# Patient Record
Sex: Male | Born: 1985 | Race: Black or African American | Hispanic: No | Marital: Married | Smoking: Never smoker
Health system: Southern US, Community
[De-identification: ages and names within clinical notes are randomized; demographics above are authoritative.]

## PROBLEM LIST (undated history)

## (undated) DIAGNOSIS — I10 Essential (primary) hypertension: Secondary | ICD-10-CM

## (undated) DIAGNOSIS — E119 Type 2 diabetes mellitus without complications: Secondary | ICD-10-CM

## (undated) DIAGNOSIS — K219 Gastro-esophageal reflux disease without esophagitis: Secondary | ICD-10-CM

## (undated) DIAGNOSIS — F419 Anxiety disorder, unspecified: Secondary | ICD-10-CM

## (undated) HISTORY — PX: ARTHROSCOPIC REPAIR ACL: SUR80

## (undated) HISTORY — PX: TONSILLECTOMY: SUR1361

---

## 2011-07-21 DIAGNOSIS — I10 Essential (primary) hypertension: Secondary | ICD-10-CM | POA: Insufficient documentation

## 2011-07-21 DIAGNOSIS — E1169 Type 2 diabetes mellitus with other specified complication: Secondary | ICD-10-CM | POA: Insufficient documentation

## 2011-07-21 DIAGNOSIS — E669 Obesity, unspecified: Secondary | ICD-10-CM | POA: Insufficient documentation

## 2011-11-14 HISTORY — PX: ARTHROSCOPIC REPAIR ACL: SUR80

## 2012-04-17 DIAGNOSIS — E119 Type 2 diabetes mellitus without complications: Secondary | ICD-10-CM | POA: Insufficient documentation

## 2013-03-19 DIAGNOSIS — M222X9 Patellofemoral disorders, unspecified knee: Secondary | ICD-10-CM | POA: Insufficient documentation

## 2014-10-21 ENCOUNTER — Ambulatory Visit: Payer: Self-pay | Admitting: Family Medicine

## 2014-10-21 LAB — CBC WITH DIFFERENTIAL/PLATELET
Basophil #: 0.1 10*3/uL (ref 0.0–0.1)
Basophil %: 0.7 %
Eosinophil #: 0.1 10*3/uL (ref 0.0–0.7)
Eosinophil %: 1.7 %
HCT: 43.4 % (ref 40.0–52.0)
HGB: 14 g/dL (ref 13.0–18.0)
Lymphocyte #: 2.8 10*3/uL (ref 1.0–3.6)
Lymphocyte %: 32.7 %
MCH: 25.7 pg — ABNORMAL LOW (ref 26.0–34.0)
MCHC: 32.2 g/dL (ref 32.0–36.0)
MCV: 80 fL (ref 80–100)
Monocyte #: 0.7 x10 3/mm (ref 0.2–1.0)
Monocyte %: 8.6 %
Neutrophil #: 4.8 10*3/uL (ref 1.4–6.5)
Neutrophil %: 56.3 %
Platelet: 226 10*3/uL (ref 150–440)
RBC: 5.43 10*6/uL (ref 4.40–5.90)
RDW: 14 % (ref 11.5–14.5)
WBC: 8.5 10*3/uL (ref 3.8–10.6)

## 2014-10-21 LAB — BASIC METABOLIC PANEL
Anion Gap: 11 (ref 7–16)
BUN: 12 mg/dL (ref 7–18)
Calcium, Total: 9 mg/dL (ref 8.5–10.1)
Chloride: 104 mmol/L (ref 98–107)
Co2: 25 mmol/L (ref 21–32)
Creatinine: 1.09 mg/dL (ref 0.60–1.30)
EGFR (African American): >60
EGFR (Non-African Amer.): >60
Glucose: 140 mg/dL — ABNORMAL HIGH (ref 65–99)
Osmolality: 281 (ref 275–301)
Potassium: 3.8 mmol/L (ref 3.5–5.1)
Sodium: 140 mmol/L (ref 136–145)

## 2014-10-21 LAB — RAPID INFLUENZA A&B ANTIGENS

## 2015-01-04 ENCOUNTER — Emergency Department: Payer: Self-pay | Admitting: Emergency Medicine

## 2015-05-16 ENCOUNTER — Encounter: Payer: Self-pay | Admitting: Emergency Medicine

## 2015-05-16 ENCOUNTER — Ambulatory Visit
Admission: EM | Admit: 2015-05-16 | Discharge: 2015-05-16 | Disposition: A | Discharge status: Home / Self Care | Payer: Managed Care, Other (non HMO) | Attending: Family Medicine | Admitting: Family Medicine

## 2015-05-16 ENCOUNTER — Ambulatory Visit: Payer: Managed Care, Other (non HMO)

## 2015-05-16 DIAGNOSIS — M25571 Pain in right ankle and joints of right foot: Secondary | ICD-10-CM | POA: Diagnosis present

## 2015-05-16 DIAGNOSIS — E119 Type 2 diabetes mellitus without complications: Secondary | ICD-10-CM | POA: Insufficient documentation

## 2015-05-16 DIAGNOSIS — Z794 Long term (current) use of insulin: Secondary | ICD-10-CM | POA: Diagnosis not present

## 2015-05-16 DIAGNOSIS — Y9301 Activity, walking, marching and hiking: Secondary | ICD-10-CM | POA: Diagnosis not present

## 2015-05-16 DIAGNOSIS — S93601A Unspecified sprain of right foot, initial encounter: Secondary | ICD-10-CM

## 2015-05-16 DIAGNOSIS — I10 Essential (primary) hypertension: Secondary | ICD-10-CM | POA: Diagnosis not present

## 2015-05-16 HISTORY — DX: Essential (primary) hypertension: I10

## 2015-05-16 HISTORY — DX: Type 2 diabetes mellitus without complications: E11.9

## 2015-05-16 MED ORDER — MELOXICAM 7.5 MG PO TABS: 7.5000 mg | ORAL_TABLET | Freq: Two times a day (BID) | ORAL | Status: DC

## 2015-05-16 MED ORDER — KETOROLAC TROMETHAMINE 60 MG/2ML IM SOLN
60.0000 mg | Freq: Once | INTRAMUSCULAR | Status: AC
  Administered 2015-05-16: 120 mg via INTRAMUSCULAR

## 2015-05-16 NOTE — ED Provider Notes (Signed)
CSN: [id number]     Arrival date & time 05/16/15  0804 History   First MD Initiated Contact with Patient 05/16/15 0845     Chief Complaint  Patient presents with  . 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 Tried Motrin 600 mg with improvement, but pain continues. Tried to work last night and pain was increased. Wears heavy work boots and they support but have no arches. Flat feet. Has inserts prescribed by [facility] but does not wear them because they are uncomfortable. Reports last A1C at 7.1  Past Medical History  Diagnosis Date  . Diabetes mellitus without complication   . Hypertension    Past Surgical History  Procedure Laterality Date  . Arthroscopic repair acl Right    History reviewed. No pertinent family history. History  Substance Use Topics  . Smoking status: Never Smoker   . Smokeless tobacco: Never Used  . Alcohol Use: No    Review of Systems Review of 10 systems negative for acute change except as referenced in HPI  Allergies  Sulfur  Home Medications   Prior to Admission medications   Medication Sig Start Date End Date Taking? Authorizing Provider  amLODipine (NORVASC) 5 MG tablet Take 5 mg by mouth daily.   Yes Historical Provider, MD  insulin aspart protamine- aspart (NOVOLOG MIX 70/30) (70-30) 100 UNIT/ML injection Inject 130 Units into the skin.   Yes Historical Provider, MD  lisinopril (PRINIVIL,ZESTRIL) 40 MG tablet Take 40 mg by mouth daily.   Yes Historical Provider, MD  meloxicam (MOBIC) 7.5 MG tablet Take 1 tablet (7.5 mg total) by mouth 2 (two) times daily. 05/16/15   Rae Halsted, PA-C   BP 126/57 mmHg  Pulse 99  Temp(Src) 97.9 F (36.6 C) (Tympanic)  Resp 18  Ht  (1.905 m)  Wt 394  lb (178.717 kg)  BMI 49.25 kg/m2  SpO2 97% Physical Exam Constitutional -alert and oriented,well appearing and in mild distress foot pain; grossly obese 394 pound  And 6'3"  .  Wearing ace wrap from office nurse Head-atraumatic Eyes- conjunctiva normal, EOMI ,conjugate gaze Nose- no congestion or rhinorrhea Mouth/throat- mucous membranes moist , Neck- supple  CV- regular rate,  Resp-no distress, normal respiratory effort,clear to auscultation bilaterally GI- no distention GU-  not examined MSK- Right mid-foot is swollen and erythematous, pain with compression and with weight bearing, he is flat footed,  ankle is normal, no swelling or pain; toes with good cap fill , normal sensation. Left leg and foot no tenderness nor edema,  Antalgic gait Neuro- normal speech and language,  Skin-warm,dry ,intact; no rash noted Psych-mood and affect grossly normal;   ED Course  Procedures (including critical care time) Labs Review Labs Reviewed - No data to display  Imaging Review Dg Foot Complete Right  05/16/2015   CLINICAL DATA:  RIGHT midfoot pain. Twisted foot. Difficulty bearing weight.  EXAM: RIGHT FOOT COMPLETE - 3+ VIEW  COMPARISON:  None.  FINDINGS: Anatomic alignment of bones of the RIGHT foot. 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 Attached metatarsal alignment appears normal. Fifth metatarsal base is normal. No fracture. Diffuse midfoot soft tissue swelling.  IMPRESSION: No acute osseous abnormality.   Electronically Signed   By: Andreas Newport M.D.   On:  05/16/2015 09:31   .this MDM   1. Foot sprain, right, initial encounter    .Plan: 1. Test/x-ray results and diagnosis reviewed with patient-given disc 2. rx as per orders; risks, benefits, potential side effects reviewed with patient 3. Recommend supportive treatment with Meloxicam; taught ace wrap support; encourage F/U re-evaluation with [facility] for insert revision 4. Encouraged to get back on DM  diet and improve HgbA1C, weight- for foot benefit 4. F/u prn if symptoms worsen or don't improve    Rae HalstedLaurie W Teshaun Olarte, PA-C 05/16/15 1041

## 2015-05-16 NOTE — ED Notes (Signed)
Right ankle pain for 3 days. Was mowing yard and stepped in hole and rolled his ankle. Painful, swelling

## 2015-05-16 NOTE — Discharge Instructions (Signed)
Foot Sprain The muscles and cord like structures which attach muscle to bone (tendons) that surround the feet are made up of units. A foot sprain can occur at the weakest spot in any of these units. This condition is most often caused by injury to or overuse of the foot, as from playing contact sports, or aggravating a previous injury, or from poor conditioning, or obesity. SYMPTOMS  Pain with movement of the foot.  Tenderness and swelling at the injury site.  Loss of strength is present in moderate or severe sprains. THE THREE GRADES OR SEVERITY OF FOOT SPRAIN ARE:  Mild (Grade I): Slightly pulled muscle without tearing of muscle or tendon fibers or loss of strength.  Moderate (Grade II): Tearing of fibers in a muscle, tendon, or at the attachment to bone, with small decrease in strength.  Severe (Grade III): Rupture of the muscle-tendon-bone attachment, with separation of fibers. Severe sprain requires surgical repair. Often repeating (chronic) sprains are caused by overuse. Sudden (acute) sprains are caused by direct injury or over-use. DIAGNOSIS  Diagnosis of this condition is usually by your own observation. If problems continue, a caregiver may be required for further evaluation and treatment. X-rays may be required to make sure there are not breaks in the bones (fractures) present. Continued problems may require physical therapy for treatment. PREVENTION  Use strength and conditioning exercises appropriate for your sport.  Warm up properly prior to working out.  Use athletic shoes that are made for the sport you are participating in.  Allow adequate time for healing. Early return to activities makes repeat injury more likely, and can lead to an unstable arthritic foot that can result in prolonged disability. Mild sprains generally heal in 3 to 10 days, with moderate and severe sprains taking 2 to 10 weeks. Your caregiver can help you determine the proper time required for  healing. HOME CARE INSTRUCTIONS   Apply ice to the injury for 15-20 minutes, 03-04 times per day. Put the ice in a plastic bag and place a towel between the bag of ice and your skin.  An elastic wrap (like an Ace bandage) may be used to keep swelling down.  Keep foot above the level of the heart, or at least raised on a footstool, when swelling and pain are present.  Try to avoid use other than gentle range of motion while the foot is painful. Do not resume use until instructed by your caregiver. Then begin use gradually, not increasing use to the point of pain. If pain does develop, decrease use and continue the above measures, gradually increasing activities that do not cause discomfort, until you gradually achieve normal use.  Use crutches if and as instructed, and for the length of time instructed.  Keep injured foot and ankle wrapped between treatments.  Massage foot and ankle for comfort and to keep swelling down. Massage from the toes up towards the knee.  Only take over-the-counter or prescription medicines for pain, discomfort, or fever as directed by your caregiver. SEEK IMMEDIATE MEDICAL CARE IF:   Your pain and swelling increase, or pain is not controlled with medications.  You have loss of feeling in your foot or your foot turns cold or blue.  You develop new, unexplained symptoms, or an increase of the symptoms that brought you to your caregiver. MAKE SURE YOU:   Understand these instructions.  Will watch your condition.  Will get help right away if you are not doing well or get worse. Document Released:   04/21/2002 Document Revised: 01/22/2012 Document Reviewed: 06/18/2008 ExitCare Patient Information 2015 [employer]. This information is not intended to replace advice given to you by your health care provider. Make sure you discuss any questions you have with your health care provider.  

## 2015-08-27 ENCOUNTER — Ambulatory Visit
Admission: EM | Admit: 2015-08-27 | Discharge: 2015-08-27 | Disposition: A | Discharge status: Home / Self Care | Payer: Managed Care, Other (non HMO) | Attending: Internal Medicine | Admitting: Internal Medicine

## 2015-08-27 ENCOUNTER — Encounter: Payer: Self-pay | Admitting: Emergency Medicine

## 2015-08-27 DIAGNOSIS — L03032 Cellulitis of left toe: Secondary | ICD-10-CM

## 2015-08-27 MED ORDER — CEPHALEXIN 500 MG PO CAPS: 500.0000 mg | ORAL_CAPSULE | Freq: Three times a day (TID) | ORAL | Status: AC

## 2015-08-27 MED ORDER — ACETAMINOPHEN 500 MG PO TABS: 1000.0000 mg | ORAL_TABLET | Freq: Four times a day (QID) | ORAL | Status: DC | PRN

## 2015-08-27 MED ORDER — MUPIROCIN 2 % EX OINT: 1.0000 "application " | TOPICAL_OINTMENT | Freq: Two times a day (BID) | CUTANEOUS | Status: DC

## 2015-08-27 NOTE — Discharge Instructions (Signed)
Paronychia Paronychia is an infection of the skin that surrounds a nail. It usually affects the skin around a fingernail, but it may also occur near a toenail. It often causes pain and swelling around the nail. This condition may come on suddenly or develop over a longer period. In some cases, a collection of pus (abscess) can form near or under the nail. Usually, paronychia is not serious and it clears up with treatment. CAUSES This condition may be caused by bacteria or fungi. It is commonly caused by either Streptococcus or Staphylococcus bacteria. The bacteria or fungi often cause the infection by getting into the affected area through an opening in the skin, such as a cut or a hangnail. RISK FACTORS This condition is more likely to develop in:  People who get their hands wet often, such as those who work as Designer, industrial/product, bartenders, or nurses.  People who bite their fingernails or suck their thumbs.  People who trim their nails too short.  People who have hangnails or injured fingertips.  People who get manicures.  People who have diabetes. SYMPTOMS Symptoms of this condition include:  Redness and swelling of the skin near the nail.  Tenderness around the nail when you touch the area.  Pus-filled bumps under the cuticle. The cuticle is the skin at the base or sides of the nail.  Fluid or pus under the nail.  Throbbing pain in the area. DIAGNOSIS This condition is usually diagnosed with a physical exam. In some cases, a sample of pus may be taken from an abscess to be tested in a lab. This can help to determine what type of bacteria or fungi is causing the condition. TREATMENT Treatment for this condition depends on the cause and severity of the condition. If the condition is mild, it may clear up on its own in a few days. Your health care provider may recommend soaking the affected area in warm water a few times a day. When treatment is needed, the options may  include:  Antibiotic medicine, if the condition is caused by a bacterial infection.  Antifungal medicine, if the condition is caused by a fungal infection.  Incision and drainage, if an abscess is present. In this procedure, the health care provider will cut open the abscess so the pus can drain out. HOME CARE INSTRUCTIONS  Soak the affected area in warm water if directed to do so by your health care provider. You may be told to do this for 20 minutes, 2-3 times a day. Keep the area dry in between soakings.  Take medicines only as directed by your health care provider.  If you were prescribed an antibiotic medicine, finish all of it even if you start to feel better.  Keep the affected area clean.  Do not try to drain a fluid-filled bump yourself.  If you will be washing dishes or performing other tasks that require your hands to get wet, wear rubber gloves. You should also wear gloves if your hands might come in contact with irritating substances, such as cleaners or chemicals.  Follow your health care provider's instructions about:  Wound care.  Bandage (dressing) changes and removal. SEEK MEDICAL CARE IF:  Your symptoms get worse or do not improve with treatment.  You have a fever or chills.  You have redness spreading from the affected area.  You have continued or increased fluid, blood, or pus coming from the affected area.  Your finger or knuckle becomes swollen or is difficult to move.  This information is not intended to replace advice given to you by your health care provider. Make sure you discuss any questions you have with your health care provider. °  °Document Released: 04/25/2001 Document Revised: 03/16/2015 Document Reviewed: 10/07/2014 °Elsevier Interactive Patient Education ©2016 Elsevier [employer] ° °Cellulitis °Cellulitis is an infection of the skin and the tissue beneath it. The infected area is usually red and tender. Cellulitis occurs most often in the arms and  lower legs.  °CAUSES  °Cellulitis is caused by bacteria that enter the skin through cracks or cuts in the skin. The most common types of bacteria that cause cellulitis are staphylococci and streptococci. °SIGNS AND SYMPTOMS  °· Redness and warmth. °· Swelling. °· Tenderness or pain. °· Fever. °DIAGNOSIS  °Your health care provider can usually determine what is wrong based on a physical exam. Blood tests may also be done. °TREATMENT  °Treatment usually involves taking an antibiotic medicine. °HOME CARE INSTRUCTIONS  °· Take your antibiotic medicine as directed by your health care provider. Finish the antibiotic even if you start to feel better. °· Keep the infected arm or leg elevated to reduce swelling. °· Apply a warm cloth to the affected area up to 4 times per day to relieve pain. °· Take medicines only as directed by your health care provider. °· Keep all follow-up visits as directed by your health care provider. °SEEK MEDICAL CARE IF:  °· You notice red streaks coming from the infected area. °· Your red area gets larger or turns dark in color. °· Your bone or joint underneath the infected area becomes painful after the skin has healed. °· Your infection returns in the same area or another area. °· You notice a swollen bump in the infected area. °· You develop new symptoms. °· You have a fever. °SEEK IMMEDIATE MEDICAL CARE IF:  °· You feel very sleepy. °· You develop vomiting or diarrhea. °· You have a general ill feeling (malaise) with muscle aches and pains. °  °This information is not intended to replace advice given to you by your health care provider. Make sure you discuss any questions you have with your health care provider. °  °Document Released: 08/09/2005 Document Revised: 07/21/2015 Document Reviewed: 01/15/2012 °Elsevier Interactive Patient Education ©2016 Elsevier Inc. ° °

## 2015-08-27 NOTE — ED Notes (Signed)
Patient c/o swelling and pain in his right 1st toe since Tuesday.  Patient denies fevers. Patient denies injury to his toe.

## 2015-08-27 NOTE — ED Provider Notes (Signed)
CSN: [id number]     Arrival date & time 08/27/15  [id number] History   First MD Initiated Contact with Patient 08/27/15 1026     Chief Complaint  Patient presents with  . Toe Pain   (Consider location/radiation/quality/duration/timing/severity/associated sxs/prior Treatment) HPI Comments: Single african [place] male type II diabetes wore different pair of shoes this week at work instead of his boots noticed right big toe with redness and pain.  Looks like pus under skin.  Tried motrin 600mg  po at 9pm last night without relief.  Patient is a 29 y.o. male presenting with toe pain. The history is provided by the patient.  Toe Pain This is a new problem. The current episode started more than 2 days ago. The problem occurs constantly. The problem has been gradually worsening. Pertinent negatives include no chest pain, no abdominal pain, no headaches and no shortness of breath. The symptoms are aggravated by walking, exertion and standing. Nothing relieves the symptoms. He has tried a cold compress, rest, food and water for the symptoms. The treatment provided no relief.    Past Medical History  Diagnosis Date  . Diabetes mellitus without complication (HCC)   . Hypertension    Past Surgical History  Procedure Laterality Date  . Arthroscopic repair acl Right    History reviewed. No pertinent family history. Social History  Substance Use Topics  . Smoking status: Never Smoker   . Smokeless tobacco: Never Used  . Alcohol Use: No    Review of Systems  Constitutional: Negative for fever, chills, diaphoresis, activity change, appetite change, fatigue and unexpected weight change.  HENT: Negative for congestion, dental problem, drooling, ear discharge, ear pain, facial swelling, hearing loss, mouth sores, nosebleeds, trouble swallowing and voice change.   Eyes: Negative for photophobia, pain, discharge, redness, itching and visual disturbance.  Respiratory: Negative for cough, choking, chest  tightness, shortness of breath, wheezing and stridor.   Cardiovascular: Negative for chest pain, palpitations and leg swelling.  Gastrointestinal: Negative for nausea, vomiting, abdominal pain, diarrhea, constipation, blood in stool and abdominal distention.  Endocrine: Negative for cold intolerance and heat intolerance.  Genitourinary: Negative for dysuria.  Musculoskeletal: Positive for myalgias. Negative for back pain, joint swelling, arthralgias, gait problem, neck pain and neck stiffness.  Skin: Positive for color change and rash. Negative for pallor and wound.  Allergic/Immunologic: Negative for environmental allergies and food allergies.  Neurological: Negative for dizziness, tremors, seizures, syncope, facial asymmetry, speech difficulty, weakness, light-headedness, numbness and headaches.  Hematological: Negative for adenopathy. Does not bruise/bleed easily.  Psychiatric/Behavioral: Negative for behavioral problems, confusion, sleep disturbance and agitation.    Allergies  Sulfur  Home Medications   Prior to Admission medications   Medication Sig Start Date End Date Taking? Authorizing Provider  insulin aspart protamine- aspart (NOVOLOG MIX 70/30) (70-30) 100 UNIT/ML injection Inject 50 Units into the skin daily with supper.   Yes Historical Provider, MD  acetaminophen (TYLENOL) 500 MG tablet Take 2 tablets (1,000 mg total) by mouth every 6 (six) hours as needed for mild pain or moderate pain. 08/27/15   Barbaraann Barthelina A Betancourt, NP  amLODipine (NORVASC) 5 MG tablet Take 5 mg by mouth daily.    Historical Provider, MD  cephALEXin (KEFLEX) 500 MG capsule Take 1 capsule (500 mg total) by mouth 3 (three) times daily. 08/27/15 09/03/15  Barbaraann Barthelina A Betancourt, NP  insulin aspart protamine- aspart (NOVOLOG MIX 70/30) (70-30) 100 UNIT/ML injection Inject 80 Units into the skin daily with breakfast.     Historical  Provider, MD  lisinopril (PRINIVIL,ZESTRIL) 40 MG tablet Take 40 mg by mouth daily.     Historical Provider, MD  mupirocin ointment (BACTROBAN) 2 % Apply 1 application topically 2 (two) times daily. 08/27/15   Barbaraann Barthel, NP   Meds Ordered and Administered this Visit  Medications - No data to display  BP 148/87 mmHg  Pulse 92  Temp(Src) 96.7 F (35.9 C) (Tympanic)  Resp 18  Ht  (1.905 m)  Wt 400 lb (181.439 kg)  BMI 50.00 kg/m2  SpO2 100% No data found.   Physical Exam  Constitutional: He is oriented to person, place, and time. Vital signs are normal. He appears well-developed and well-nourished. No distress.  HENT:  Head: Normocephalic and atraumatic.  Right Ear: External ear normal.  Left Ear: External ear normal.  Nose: Nose normal.  Mouth/Throat: Oropharynx is clear and moist. No oropharyngeal exudate.  Eyes: Conjunctivae, EOM and lids are normal. Pupils are equal, round, and reactive to light. Right eye exhibits no discharge. Left eye exhibits no discharge. No scleral icterus.  Neck: Trachea normal and normal range of motion. Neck supple. No tracheal deviation present. No thyromegaly present.  Cardiovascular: Normal rate, regular rhythm, normal heart sounds and intact distal pulses.  Exam reveals no gallop and no friction rub.   No murmur heard. Pulmonary/Chest: Effort normal and breath sounds normal. No stridor. No respiratory distress. He has no wheezes. He has no rales. He exhibits no tenderness.  Abdominal: Soft. He exhibits no distension.  Musculoskeletal: Normal range of motion. He exhibits edema and tenderness.       Right hip: Normal.       Left hip: Normal.       Right knee: Normal.       Left knee: Normal.       Right ankle: Normal.       Left ankle: Normal.       Left foot: There is tenderness and swelling. There is normal range of motion, no bony tenderness, normal capillary refill, no crepitus, no deformity and no laceration.       Feet:  Lymphadenopathy:    He has no cervical adenopathy.  Neurological: He is alert and oriented  to person, place, and time. He displays no atrophy and no tremor. No cranial nerve deficit or sensory deficit. He exhibits normal muscle tone. He displays no seizure activity. Coordination and gait normal. GCS eye subscore is 4. GCS verbal subscore is 5. GCS motor subscore is 6.  Skin: Skin is warm, dry and intact. Rash noted. No abrasion, no bruising, no burn, no ecchymosis, no laceration, no lesion, no petechiae and no purpura noted. Rash is macular and pustular. Rash is not papular, not maculopapular, not nodular, not vesicular and not urticarial. He is not diaphoretic. There is erythema. No cyanosis. No pallor. Nails show no clubbing.  Psychiatric: He has a normal mood and affect. His speech is normal and behavior is normal. Judgment and thought content normal. Cognition and memory are normal.  Nursing note and vitals reviewed.   ED Course  Procedures (including critical care time)  Labs Review Labs Reviewed - No data to display  Imaging Review No results found.     MDM   1. Paronychia of great toe, left    Patient not working this weekend refused work note.  Discussed I am working again 17 Oct if not resolved over weekend should be improving with prescribed therapy.  Diabetic with paronychia will start keflex as  sulfa allergy.  Keflex  po TID x 7 days.  Start Epsom salt warm/hot soaks 2-3 times per day for 20 minutes.  Take oral antibiotic may apply bactroban 2% ointment BID if area opens/starts to drain.  Avoiding incision and drainage at this time due to small area and diabetic.  Elevate foot when sitting.  May take tylenol  po QID prn pain.  If not enough relief with elevating and tylenol then may add motrin  po TID prn but discussed will counteract his blood pressure medication.  Monitor for red streaks, fever, worsening pain in affected extremity.  Purulent discharge may develop in the next 24 hours but should decrease and resolve with oral antibiotics.  Follow up with  PCM if worsening pain, red streaks, pain, and discharge. ER if streaking towards knee this weekend for re-evaluation.  Patient given [employer] handout on paronychia.  Patient verbalized understanding of instructions, agreed with plan of care and had no further questions at this time.      Barbaraann Barthel, NP 08/27/15 1611

## 2015-11-08 ENCOUNTER — Encounter: Payer: Self-pay | Admitting: Emergency Medicine

## 2015-11-08 ENCOUNTER — Ambulatory Visit
Admission: EM | Admit: 2015-11-08 | Discharge: 2015-11-08 | Disposition: A | Discharge status: Home / Self Care | Payer: Managed Care, Other (non HMO) | Attending: Family Medicine | Admitting: Family Medicine

## 2015-11-08 DIAGNOSIS — K529 Noninfective gastroenteritis and colitis, unspecified: Secondary | ICD-10-CM | POA: Diagnosis not present

## 2015-11-08 LAB — COMPREHENSIVE METABOLIC PANEL
ALT: 30 U/L (ref 17–63)
AST: 25 U/L (ref 15–41)
Albumin: 4.7 g/dL (ref 3.5–5.0)
Alkaline Phosphatase: 42 U/L (ref 38–126)
Anion gap: 9 (ref 5–15)
BUN: 11 mg/dL (ref 6–20)
CO2: 22 mmol/L (ref 22–32)
Calcium: 9.8 mg/dL (ref 8.9–10.3)
Chloride: 105 mmol/L (ref 101–111)
Creatinine, Ser: 0.79 mg/dL (ref 0.61–1.24)
GFR calc Af Amer: >60 mL/min (ref >60)
GFR calc non Af Amer: >60 mL/min (ref >60)
Glucose, Bld: 240 mg/dL — ABNORMAL HIGH (ref 65–99)
Potassium: 4 mmol/L (ref 3.5–5.1)
Sodium: 136 mmol/L (ref 135–145)
Total Bilirubin: 0.7 mg/dL (ref 0.3–1.2)
Total Protein: 8.1 g/dL (ref 6.5–8.1)

## 2015-11-08 LAB — CBC WITH DIFFERENTIAL/PLATELET
Basophils Absolute: 0.1 10*3/uL (ref 0–0.1)
Basophils Relative: 1 %
Eosinophils Absolute: 0.2 10*3/uL (ref 0–0.7)
Eosinophils Relative: 2 %
HCT: 43.6 % (ref 40.0–52.0)
Hemoglobin: 14.4 g/dL (ref 13.0–18.0)
Lymphocytes Relative: 30 %
Lymphs Abs: 3 10*3/uL (ref 1.0–3.6)
MCH: 26 pg (ref 26.0–34.0)
MCHC: 33.1 g/dL (ref 32.0–36.0)
MCV: 78.5 fL — ABNORMAL LOW (ref 80.0–100.0)
Monocytes Absolute: 0.8 10*3/uL (ref 0.2–1.0)
Monocytes Relative: 9 %
Neutro Abs: 5.7 10*3/uL (ref 1.4–6.5)
Neutrophils Relative %: 58 %
Platelets: 246 10*3/uL (ref 150–440)
RBC: 5.55 MIL/uL (ref 4.40–5.90)
RDW: 14.5 % (ref 11.5–14.5)
WBC: 9.8 10*3/uL (ref 3.8–10.6)

## 2015-11-08 MED ORDER — ONDANSETRON 8 MG PO TBDP: 8.0000 mg | ORAL_TABLET | Freq: Two times a day (BID) | ORAL | Status: DC

## 2015-11-08 MED ORDER — ONDANSETRON 8 MG PO TBDP
8.0000 mg | ORAL_TABLET | Freq: Once | ORAL | Status: AC
  Administered 2015-11-08: 8 mg via ORAL

## 2015-11-08 NOTE — ED Provider Notes (Signed)
CSN: [id number]     Arrival date & time 11/08/15  1559 History   First MD Initiated Contact with Patient 11/08/15 1906     Chief Complaint  Patient presents with  . Emesis   (Consider location/radiation/quality/duration/timing/severity/associated sxs/prior Treatment) HPI Comments: 29 yo male with a 1 day h/o vomiting that started yesterday; has vomited about 6 times since yesterday. Last several hours has been able to keep fluids down. Currently feeling slightly nauseated. Denies any fevers, chills, diarrhea, nasal congestion, URI symptoms, abdominal pains. States has had sick contacts at home (children).   The history is provided by the patient.    Past Medical History  Diagnosis Date  . Diabetes mellitus without complication (HCC)   . Hypertension    Past Surgical History  Procedure Laterality Date  . Arthroscopic repair acl Right    History reviewed. No pertinent family history. Social History  Substance Use Topics  . Smoking status: Never Smoker   . Smokeless tobacco: Never Used  . Alcohol Use: No    Review of Systems  Allergies  Sulfur  Home Medications   Prior to Admission medications   Medication Sig Start Date End Date Taking? Authorizing Provider  acetaminophen (TYLENOL) 500 MG tablet Take 2 tablets (1,000 mg total) by mouth every 6 (six) hours as needed for mild pain or moderate pain. 08/27/15   Barbaraann Barthel, NP  amLODipine (NORVASC) 5 MG tablet Take 5 mg by mouth daily.    Historical Provider, MD  insulin aspart protamine- aspart (NOVOLOG MIX 70/30) (70-30) 100 UNIT/ML injection Inject 80 Units into the skin daily with breakfast.     Historical Provider, MD  insulin aspart protamine- aspart (NOVOLOG MIX 70/30) (70-30) 100 UNIT/ML injection Inject 50 Units into the skin daily with supper.    Historical Provider, MD  lisinopril (PRINIVIL,ZESTRIL) 40 MG tablet Take 40 mg by mouth daily.    Historical Provider, MD  mupirocin ointment (BACTROBAN) 2 % Apply 1  application topically 2 (two) times daily. 08/27/15   Barbaraann Barthel, NP  ondansetron (ZOFRAN ODT) 8 MG disintegrating tablet Take 1 tablet (8 mg total) by mouth 2 (two) times daily. 11/08/15   Payton Mccallum, MD   Meds Ordered and Administered this Visit   Medications  ondansetron (ZOFRAN-ODT) disintegrating tablet 8 mg (8 mg Oral Given 11/08/15 1918)    BP 157/99 mmHg  Pulse 102  Temp(Src) 97.9 F (36.6 C) (Tympanic)  Resp 18  Ht  (1.905 m)  Wt 391 lb (177.356 kg)  BMI 48.87 kg/m2  SpO2 100% Orthostatic VS for the past 24 hrs:  BP- Lying Pulse- Lying BP- Sitting Pulse- Sitting BP- Standing at 0 minutes Pulse- Standing at 0 minutes  11/08/15 1823 (!) 157/99 mmHg 102 162/67 mmHg 104 150/80 mmHg 112    Physical Exam  Constitutional: He is oriented to person, place, and time. He appears well-developed and well-nourished. No distress.  HENT:  Head: Normocephalic and atraumatic.  Cardiovascular: Normal rate, regular rhythm, normal heart sounds and intact distal pulses.   No murmur heard. Pulmonary/Chest: Effort normal and breath sounds normal. No respiratory distress. He has no wheezes. He has no rales.  Abdominal: Soft. Bowel sounds are normal. He exhibits no distension and no mass. There is no tenderness. There is no rebound and no guarding.  Neurological: He is alert and oriented to person, place, and time.  Skin: No rash noted. He is not diaphoretic.  Nursing note and vitals reviewed.   ED Course  Procedures (including critical care time)  Labs Review Labs Reviewed  COMPREHENSIVE METABOLIC PANEL - Abnormal; Notable for the following:    Glucose, Bld 240 (*)    All other components within normal limits  CBC WITH DIFFERENTIAL/PLATELET - Abnormal; Notable for the following:    MCV 78.5 (*)    All other components within normal limits    Imaging Review No results found.   Visual Acuity Review  Right Eye Distance:   Left Eye Distance:   Bilateral Distance:     Right Eye Near:   Left Eye Near:    Bilateral Near:         MDM   1. Acute gastroenteritis    Discharge Medication List as of 11/08/2015  8:24 PM    START taking these medications   Details  ondansetron (ZOFRAN ODT) 8 MG disintegrating tablet Take 1 tablet (8 mg total) by mouth 2 (two) times daily., Starting 11/08/2015, Until Discontinued, Print        1. Lab results and diagnosis reviewed with patient 2. rx as per orders above; reviewed possible side effects, interactions, risks and benefits  3. Patient given zofran 8mg  po x 1 in clinic with improvement of symptoms; tolerating po fluids 4. Recommend supportive treatment with increased fluids/clear liquids and advance slowly as tolerated 5. Follow-up prn if symptoms worsen or don't improve    Payton Mccallumrlando Venesa Semidey, MD 11/08/15 2237

## 2015-11-08 NOTE — ED Notes (Signed)
Patient c/o vomiting that started last night.  Patient denies diarrhea.  Patient denies any cold symptoms.  Patient denies fevers.

## 2015-12-19 ENCOUNTER — Ambulatory Visit
Admission: EM | Admit: 2015-12-19 | Discharge: 2015-12-19 | Disposition: A | Discharge status: Home / Self Care | Payer: Managed Care, Other (non HMO) | Attending: Family Medicine | Admitting: Family Medicine

## 2015-12-19 ENCOUNTER — Encounter: Payer: Self-pay | Admitting: *Deleted

## 2015-12-19 DIAGNOSIS — L0591 Pilonidal cyst without abscess: Secondary | ICD-10-CM

## 2015-12-19 DIAGNOSIS — J069 Acute upper respiratory infection, unspecified: Secondary | ICD-10-CM | POA: Diagnosis not present

## 2015-12-19 LAB — GLUCOSE, CAPILLARY: Glucose-Capillary: 317 mg/dL — ABNORMAL HIGH (ref 65–99)

## 2015-12-19 MED ORDER — FLUTICASONE PROPIONATE 50 MCG/ACT NA SUSP: 1.0000 | Freq: Every day | NASAL | Status: DC

## 2015-12-19 MED ORDER — DOXYCYCLINE HYCLATE 100 MG PO CAPS: 100.0000 mg | ORAL_CAPSULE | Freq: Two times a day (BID) | ORAL | Status: DC

## 2015-12-19 NOTE — ED Provider Notes (Signed)
CSN: [id number]     Arrival date & time 12/19/15  4401 History   First MD Initiated Contact with Patient 12/19/15 1054     Chief Complaint  Patient presents with  . Nasal Congestion  . Cough   (Consider location/radiation/quality/duration/timing/severity/associated sxs/prior Treatment) HPI: Patient presents with symptoms of nasal congestion, cough for the past few weeks. Patient states that the drainage is sometimes colored. He has used some over-the-counter medications with some relief. He denies any fever. He denies any body aches, chest pain, shortness of breath, nausea, vomiting, diarrhea, abdominal pain. He also states that he has a pilonidal cyst developing again. He states that he had one drained 3-4 years ago. He denies any drainage from the site. Patient is diabetic but states that he has not been checking his blood sugar recently.  Past Medical History  Diagnosis Date  . Diabetes mellitus without complication (HCC)   . Hypertension    Past Surgical History  Procedure Laterality Date  . Arthroscopic repair acl Right    History reviewed. No pertinent family history. Social History  Substance Use Topics  . Smoking status: Never Smoker   . Smokeless tobacco: Never Used  . Alcohol Use: Yes     Comment: Occ. Drinking    Review of Systems: Negative except mentioned above.   Allergies  Sulfur  Home Medications   Prior to Admission medications   Medication Sig Start Date End Date Taking? Authorizing Provider  acetaminophen (TYLENOL) 500 MG tablet Take 2 tablets (1,000 mg total) by mouth every 6 (six) hours as needed for mild pain or moderate pain. 08/27/15  Yes Barbaraann Barthel, NP  amLODipine (NORVASC) 5 MG tablet Take 5 mg by mouth daily.   Yes Historical Provider, MD  insulin aspart protamine- aspart (NOVOLOG MIX 70/30) (70-30) 100 UNIT/ML injection Inject 80 Units into the skin daily with breakfast.    Yes Historical Provider, MD  insulin aspart protamine- aspart  (NOVOLOG MIX 70/30) (70-30) 100 UNIT/ML injection Inject 50 Units into the skin daily with supper.   Yes Historical Provider, MD  lisinopril (PRINIVIL,ZESTRIL) 40 MG tablet Take 40 mg by mouth daily.   Yes Historical Provider, MD  metFORMIN (GLUCOPHAGE) 500 MG tablet Take 500 mg by mouth 2 (two) times daily with a meal.   Yes Historical Provider, MD  doxycycline (VIBRAMYCIN) 100 MG capsule Take 1 capsule (100 mg total) by mouth 2 (two) times daily. 12/19/15   Jolene Provost, MD  fluticasone (FLONASE) 50 MCG/ACT nasal spray Place 1 spray into both nostrils daily. 12/19/15   Jolene Provost, MD  mupirocin ointment (BACTROBAN) 2 % Apply 1 application topically 2 (two) times daily. 08/27/15   Barbaraann Barthel, NP  ondansetron (ZOFRAN ODT) 8 MG disintegrating tablet Take 1 tablet (8 mg total) by mouth 2 (two) times daily. 11/08/15   Payton Mccallum, MD   Meds Ordered and Administered this Visit  Medications - No data to display  BP 147/69 mmHg  Pulse 118  Temp(Src) 98.9 F (37.2 C) (Oral)  Resp 16  Ht  (1.905 m)  Wt 391 lb (177.356 kg)  BMI 48.87 kg/m2  SpO2 98% No data found.   Physical Exam:  GENERAL: NAD HEENT: mild pharyngeal erythema, no exudate, no erythema of TMs, no cervical LAD RESP: CTA B CARD: RRR SKIN: quarter sized hard area lower back area at cleft, not draining, mild tenderness of site NEURO: CN II-XII grossly intact   ED Course  Procedures (including critical care time)  Labs Review Labs Reviewed - No data to display  Imaging Review No results found.   MDM   1. Pilonidal cyst   2. URI, acute   Will treat patient with doxycyline and given patient's allergy to sulfa medications I've asked the patient to apply warm compresses to the area to see if the area drains. I recommended that he follow-up with his primary care physician if this symptoms persist or worsen. The doxycycline should also help his upper respiratory/sinus symptoms. I have prescribed him Flonase and  discussed the use of Claritin and Delsym when necessary. He can take Tylenol/Motrin when necessary for any fever or pain.    Jolene Provost, MD 12/19/15 [phone]

## 2015-12-19 NOTE — ED Notes (Signed)
Patient states that he has nasal congestion and a cough which started January 8th.  Denies Fever.  Recurrent Pilonidal Cyst.

## 2015-12-19 NOTE — Discharge Instructions (Signed)
Can try Claritin, Delysm for symptoms. If cyst doesn't drain recommend follow-up with primary medical physician.

## 2016-01-07 ENCOUNTER — Encounter: Payer: Self-pay | Admitting: *Deleted

## 2016-01-07 ENCOUNTER — Ambulatory Visit
Admission: EM | Admit: 2016-01-07 | Discharge: 2016-01-07 | Disposition: A | Discharge status: Home / Self Care | Payer: Managed Care, Other (non HMO) | Attending: Family Medicine | Admitting: Family Medicine

## 2016-01-07 DIAGNOSIS — R197 Diarrhea, unspecified: Secondary | ICD-10-CM | POA: Diagnosis not present

## 2016-01-07 DIAGNOSIS — R112 Nausea with vomiting, unspecified: Secondary | ICD-10-CM

## 2016-01-07 LAB — CBC WITH DIFFERENTIAL/PLATELET
Basophils Absolute: 0 10*3/uL (ref 0–0.1)
Basophils Relative: 1 %
Eosinophils Absolute: 0 10*3/uL (ref 0–0.7)
Eosinophils Relative: 1 %
HCT: 44.3 % (ref 40.0–52.0)
Hemoglobin: 14.8 g/dL (ref 13.0–18.0)
Lymphocytes Relative: 26 %
Lymphs Abs: 1.5 10*3/uL (ref 1.0–3.6)
MCH: 25.8 pg — ABNORMAL LOW (ref 26.0–34.0)
MCHC: 33.4 g/dL (ref 32.0–36.0)
MCV: 77.3 fL — ABNORMAL LOW (ref 80.0–100.0)
Monocytes Absolute: 0.9 10*3/uL (ref 0.2–1.0)
Monocytes Relative: 16 %
Neutro Abs: 3.2 10*3/uL (ref 1.4–6.5)
Neutrophils Relative %: 56 %
Platelets: 228 10*3/uL (ref 150–440)
RBC: 5.73 MIL/uL (ref 4.40–5.90)
RDW: 14.6 % — ABNORMAL HIGH (ref 11.5–14.5)
WBC: 5.8 10*3/uL (ref 3.8–10.6)

## 2016-01-07 LAB — BASIC METABOLIC PANEL
Anion gap: 8 (ref 5–15)
BUN: 9 mg/dL (ref 6–20)
CO2: 20 mmol/L — ABNORMAL LOW (ref 22–32)
Calcium: 8.6 mg/dL — ABNORMAL LOW (ref 8.9–10.3)
Chloride: 104 mmol/L (ref 101–111)
Creatinine, Ser: 0.86 mg/dL (ref 0.61–1.24)
GFR calc Af Amer: >60 mL/min (ref >60)
GFR calc non Af Amer: >60 mL/min (ref >60)
Glucose, Bld: 252 mg/dL — ABNORMAL HIGH (ref 65–99)
Potassium: 3.5 mmol/L (ref 3.5–5.1)
Sodium: 132 mmol/L — ABNORMAL LOW (ref 135–145)

## 2016-01-07 LAB — RAPID INFLUENZA A&B ANTIGENS
Influenza A (ARMC): NOT DETECTED
Influenza B (ARMC): NOT DETECTED

## 2016-01-07 LAB — GLUCOSE, CAPILLARY: Glucose-Capillary: 216 mg/dL — ABNORMAL HIGH (ref 65–99)

## 2016-01-07 MED ORDER — ONDANSETRON HCL 4 MG/2ML IJ SOLN
4.0000 mg | Freq: Once | INTRAMUSCULAR | Status: AC
  Administered 2016-01-07: 4 mg via INTRAVENOUS

## 2016-01-07 MED ORDER — ONDANSETRON 4 MG PO TBDP: 4.0000 mg | ORAL_TABLET | Freq: Three times a day (TID) | ORAL | Status: DC | PRN

## 2016-01-07 MED ORDER — SODIUM CHLORIDE 0.9 % IV SOLN
Freq: Once | INTRAVENOUS | Status: AC
  Administered 2016-01-07: 15:00:00 via INTRAVENOUS

## 2016-01-07 MED ORDER — LOPERAMIDE HCL 2 MG PO CAPS: 2.0000 mg | ORAL_CAPSULE | Freq: Once | ORAL | Status: DC

## 2016-01-07 NOTE — ED Provider Notes (Signed)
[facility]  ____________________________________________  Time seen: Approximately 2:18 PM  I have reviewed the triage vital signs and the nursing notes.   HISTORY  Chief Complaint Nausea and Emesis  HPI Perry Macdonald is a 30 y.o. male presents with complaints of nausea, vomiting and diarrhea since yesterday afternoon. Patient reports his 3-month-old daughter had similar earlier this week. Patient states that he does have some intermittent crampy abdominal discomfort that is present just prior to needing to vomit or have diarrhea. States in absence of that he does not have any abdominal pain. Denies current abdominal pain. Reports that he has vomited approximately 4-5 times today and no diarrhea approximately 6 times today. Denies any changes color and vomit or diarrhea. Denies any blood, blood in toilet bowl, dark stool. Reports has tolerated some Powerade today without vomiting. States that he is diabetic and has not taken any of his medications today or last night since he is not eating well. Denies changes in foods recently. Denies known trigger.  Denies other known sick contacts. Denies fevers. Denies dizziness, weakness, abdominal pain, chest pain, back pain, or dysuria. States last night blood sugar was 250.  PCP: [facility]   Past Medical History  Diagnosis Date  . Diabetes mellitus without complication (HCC)   . Hypertension    patient reports "heart rate always elevated at doctor's office ".  There are no active problems to display for this patient.   Past Surgical History  Procedure Laterality Date  . Arthroscopic repair acl Right     Current Outpatient Rx  Name  Route  Sig  Dispense  Refill  . acetaminophen (TYLENOL) 500 MG tablet   Oral   Take 2 tablets (1,000 mg total) by mouth every 6 (six) hours as needed for mild pain or moderate pain.   90 tablet   0   . amLODipine (NORVASC) 5 MG tablet   Oral   Take 5 mg by mouth daily.         .  fluticasone (FLONASE) 50 MCG/ACT nasal spray   Each Nare   Place 1 spray into both nostrils daily.   1 g   0   . insulin aspart protamine- aspart (NOVOLOG MIX 70/30) (70-30) 100 UNIT/ML injection   Subcutaneous   Inject 80 Units into the skin daily with breakfast.          . insulin aspart protamine- aspart (NOVOLOG MIX 70/30) (70-30) 100 UNIT/ML injection   Subcutaneous   Inject 50 Units into the skin daily with supper.         Marland Kitchen lisinopril (PRINIVIL,ZESTRIL) 40 MG tablet   Oral   Take 40 mg by mouth daily.         . metFORMIN (GLUCOPHAGE) 500 MG tablet   Oral   Take 500 mg by mouth 2 (two) times daily with a meal.         .           .           .             Allergies Sulfur  History reviewed. No pertinent family history.  Social History Social History  Substance Use Topics  . Smoking status: Never Smoker   . Smokeless tobacco: Never Used  . Alcohol Use: Yes     Comment: Occ. Drinking    Review of Systems Constitutional: No fever/chills Eyes: No visual changes. ENT: No sore throat. Cardiovascular: Denies chest pain. Respiratory: Denies  shortness of breath. Gastrointestinal: No abdominal pain.   Genitourinary: Negative for dysuria. Musculoskeletal: Negative for back pain. Skin: Negative for rash. Neurological: Negative for headaches, focal weakness or numbness.  10-point ROS otherwise negative.  ____________________________________________   PHYSICAL EXAM:  VITAL SIGNS: ED Triage Vitals  Enc Vitals Group     BP 01/07/16 1338 133/88 mmHg     Pulse Rate 01/07/16 1338 117     Resp 01/07/16 1338 18     Temp 01/07/16 1338 98.9 F (37.2 C)     Temp Source 01/07/16 1338 Oral     SpO2 01/07/16 1338 100 %     Weight 01/07/16 1338 371 lb (168.284 kg)     Height 01/07/16 1338  (1.905 m)     Head Cir --      Peak Flow --      Pain Score 01/07/16 1349 0     Pain Loc --      Pain Edu? --      Excl. in GC? --     Orthostatic Vital Signs  SH  Orthostatic Lying  - BP- Lying: 136/69 mmHg ; Pulse- Lying: 102  Orthostatic Sitting - BP- Sitting: 139/79 mmHg ; Pulse- Sitting: 107  Orthostatic Standing at 0 minutes - BP- Standing at 0 minutes: 113/62 mmHg ; Pulse- Standing at 0 minutes: 118      Today's Vitals   01/07/16 1338 01/07/16 1349  BP: 133/88   Pulse: 117   Temp: 98.9 F (37.2 C)   TempSrc: Oral   Resp: 18   Height:  (1.905 m)   Weight: 371 lb (168.284 kg)   SpO2: 100%   PainSc:  0-No pain     Constitutional: Alert and oriented. Well appearing and in no acute distress. Eyes: Conjunctivae are normal. PERRL. EOMI. Head: Atraumatic. Nontender.  Ears: no erythema, normal TMs bilaterally.   Nose: No congestion/rhinnorhea.  Mouth/Throat: Mucous membranes are moist.  Oropharynx non-erythematous. No tonsillar swelling or exudate.  Neck: No stridor.  No cervical spine tenderness to palpation. Hematological/Lymphatic/Immunilogical: No cervical lymphadenopathy. Cardiovascular: Normal rate, regular rhythm. Grossly normal heart sounds.  Good peripheral circulation. Respiratory: Normal respiratory effort.  No retractions. Lungs CTAB. Gastrointestinal: Soft and nontender. Obese abdomen. Normal Bowel sounds. No CVA tenderness. Musculoskeletal: No lower or upper extremity tenderness nor edema.  No cervical, thoracic or lumbar tenderness to palpation.  Neurologic:  Normal speech and language. No gross focal neurologic deficits are appreciated. No gait instability. Skin:  Skin is warm, dry and intact. No rash noted. Psychiatric: Mood and affect are normal. Speech and behavior are normal.  ____________________________________________   LABS (all labs ordered are listed, but only abnormal results are displayed)  Labs Reviewed  GLUCOSE, CAPILLARY - Abnormal; Notable for the following:    Glucose-Capillary 216 (*)    All other components within normal limits  CBC WITH DIFFERENTIAL/PLATELET - Abnormal; Notable for the  following:    MCV 77.3 (*)    MCH 25.8 (*)    RDW 14.6 (*)    All other components within normal limits  BASIC METABOLIC PANEL - Abnormal; Notable for the following:    Sodium 132 (*)    CO2 20 (*)    Glucose, Bld 252 (*)    Calcium 8.6 (*)    All other components within normal limits  RAPID INFLUENZA A&B ANTIGENS ([facility] ONLY)     INITIAL IMPRESSION / ASSESSMENT AND PLAN / ED COURSE  Pertinent labs & imaging results that were  available during my care of the patient were reviewed by me and considered in my medical decision making (see chart for details).  Well-appearing patient. No acute distress. Presents for the complaints of nausea, vomiting, diarrhea since yesterday. Patient reports that he has not taken any medications for the same. Reports daughter with similar early this week just prior to his symptom onset. Denies fevers. Moist mucous membranes. Lungs clear throughout. Abdomen soft and nontender. Suspect viral gastroenteritis. Will administer 1 L normal saline with IV 4 mg Zofran and evaluate CBC and BMP.  Labs reviewed. Influenza negative. After 1 L normal saline and Zofran, patient reports that he is feeling much better. Abdomen soft and nontender. Denies dizziness with positions changes. Patient sitting up smiling of the side of bed. Patient states that he has tolerated crackers as well as drink several cups of water in urgent care. Patient states his fund better and ready to go home. Suspect viral gastroenteritis. Will treat supportively with when necessary Zofran, Imodium, following BRAT diet. Encouraged rest, fluids and closely follow-up. Encouraged close monitoring of blood sugar at home and only taking medications if he is eating adequately tolerate his insulin as directed by PCP.   Discussed follow up with Primary care physician this week. Discussed follow up and return parameters including no resolution or any worsening concerns. Patient verbalized understanding and agreed to  plan.  ____________________________________________   FINAL CLINICAL IMPRESSION(S) / ED DIAGNOSES  Final diagnoses:  Nausea vomiting and diarrhea     Note: This dictation was prepared with Dragon dictation along with smaller phrase technology. Any transcriptional errors that result from this process are unintentional.    Renford Dills, NP 01/07/16 1658  Renford Dills, NP 01/07/16 1701

## 2016-01-07 NOTE — ED Notes (Signed)
Patient started with symptoms of nausea and vomiting yesterday and has not been able to keep food or liquid down. Patient has not taken any of his diabetic medications today.

## 2016-01-07 NOTE — Discharge Instructions (Signed)
Take medication as prescribed. Rest. Drink plenty of fluids. Follow BRAT diet.   Follow up with your primary care physician this week as needed. Return to Urgent care or proceed to ER for inability to tolerate food or fluids, abdominal pain, fever, urinary changes, new or worsening concerns.    Food Choices to Help Relieve Diarrhea, Adult When you have diarrhea, the foods you eat and your eating habits are very important. Choosing the right foods and drinks can help relieve diarrhea. Also, because diarrhea can last up to 7 days, you need to replace lost fluids and electrolytes (such as sodium, potassium, and chloride) in order to help prevent dehydration.  WHAT GENERAL GUIDELINES DO I NEED TO FOLLOW?  Slowly drink 1 cup (8 oz) of fluid for each episode of diarrhea. If you are getting enough fluid, your urine will be clear or pale yellow.  Eat starchy foods. Some good choices include white rice, white toast, pasta, low-fiber cereal, baked potatoes (without the skin), saltine crackers, and bagels.  Avoid large servings of any cooked vegetables.  Limit fruit to two servings per day. A serving is  cup or 1 small piece.  Choose foods with less than 2 g of fiber per serving.  Limit fats to less than 8 tsp (38 g) per day.  Avoid fried foods.  Eat foods that have probiotics in them. Probiotics can be found in certain dairy products.  Avoid foods and beverages that may increase the speed at which food moves through the stomach and intestines (gastrointestinal tract). Things to avoid include:  High-fiber foods, such as dried fruit, raw fruits and vegetables, nuts, seeds, and whole grain foods.  Spicy foods and high-fat foods.  Foods and beverages sweetened with high-fructose corn syrup, honey, or sugar alcohols such as xylitol, sorbitol, and mannitol. WHAT FOODS ARE RECOMMENDED? Grains White rice. White, [place], or pita breads (fresh or toasted), including plain rolls, buns, or bagels. White  pasta. Saltine, soda, or graham crackers. Pretzels. Low-fiber cereal. Cooked cereals made with water (such as cornmeal, farina, or cream cereals). Plain muffins. Matzo. Melba toast. Zwieback.  Vegetables Potatoes (without the skin). Strained tomato and vegetable juices. Most well-cooked and canned vegetables without seeds. Tender lettuce. Fruits Cooked or canned applesauce, apricots, cherries, fruit cocktail, grapefruit, peaches, pears, or plums. Fresh bananas, apples without skin, cherries, grapes, cantaloupe, grapefruit, peaches, oranges, or plums.  Meat and Other Protein Products Baked or boiled chicken. Eggs. Tofu. Fish. Seafood. Smooth peanut butter. Ground or well-cooked tender beef, ham, veal, lamb, pork, or poultry.  Dairy Plain yogurt, kefir, and unsweetened liquid yogurt. Lactose-free milk, buttermilk, or soy milk. Plain hard cheese. Beverages Sport drinks. Clear broths. Diluted fruit juices (except prune). Regular, caffeine-free sodas such as ginger ale. Water. Decaffeinated teas. Oral rehydration solutions. Sugar-free beverages not sweetened with sugar alcohols. Other Bouillon, broth, or soups made from recommended foods.  The items listed above may not be a complete list of recommended foods or beverages. Contact your dietitian for more options. WHAT FOODS ARE NOT RECOMMENDED? Grains Whole grain, whole wheat, bran, or rye breads, rolls, pastas, crackers, and cereals. Wild or brown rice. Cereals that contain more than 2 g of fiber per serving. Corn tortillas or taco shells. Cooked or dry oatmeal. Granola. Popcorn. Vegetables Raw vegetables. Cabbage, broccoli, Brussels sprouts, artichokes, baked beans, beet greens, corn, kale, legumes, peas, sweet potatoes, and yams. Potato skins. Cooked spinach and cabbage. Fruits Dried fruit, including raisins and dates. Raw fruits. Stewed or dried prunes. Fresh apples  with skin, apricots, mangoes, pears, raspberries, and strawberries.  Meat and  Other Protein Products Chunky peanut butter. Nuts and seeds. Beans and lentils. Tomasa Blase.  Dairy High-fat cheeses. Milk, chocolate milk, and beverages made with milk, such as milk shakes. Cream. Ice cream. Sweets and Desserts Sweet rolls, doughnuts, and sweet breads. Pancakes and waffles. Fats and Oils Butter. Cream sauces. Margarine. Salad oils. Plain salad dressings. Olives. Avocados.  Beverages Caffeinated beverages (such as coffee, tea, soda, or energy drinks). Alcoholic beverages. Fruit juices with pulp. Prune juice. Soft drinks sweetened with high-fructose corn syrup or sugar alcohols. Other Coconut. Hot sauce. Chili powder. Mayonnaise. Gravy. Cream-based or milk-based soups.  The items listed above may not be a complete list of foods and beverages to avoid. Contact your dietitian for more information. WHAT SHOULD I DO IF I BECOME DEHYDRATED? Diarrhea can sometimes lead to dehydration. Signs of dehydration include dark urine and dry mouth and skin. If you think you are dehydrated, you should rehydrate with an oral rehydration solution. These solutions can be purchased at pharmacies, retail stores, or online.  Drink -1 cup (120-240 mL) of oral rehydration solution each time you have an episode of diarrhea. If drinking this amount makes your diarrhea worse, try drinking smaller amounts more often. For example, drink 1-3 tsp (5-15 mL) every 5-10 minutes.  A general rule for staying hydrated is to drink 1-2 L of fluid per day. Talk to your health care provider about the specific amount you should be drinking each day. Drink enough fluids to keep your urine clear or pale yellow.   This information is not intended to replace advice given to you by your health care provider. Make sure you discuss any questions you have with your health care provider.   Document Released: 01/20/2004 Document Revised: 11/20/2014 Document Reviewed: 09/22/2013 Elsevier Interactive Patient Education 2016 Elsevier  Inc[employer]  Diarrhea Diarrhea is frequent loose and watery bowel movements. It can cause you to feel weak and dehydrated. Dehydration can cause you to become tired and thirsty, have a dry mouth, and have decreased urination that often is dark yellow. Diarrhea is a sign of another problem, most often an infection that will not last long. In most cases, diarrhea typically lasts 2-3 days. However, it can last longer if it is a sign of something more serious. It is important to treat your diarrhea as directed by your caregiver to lessen or prevent future episodes of diarrhea. CAUSES  Some common causes include:  Gastrointestinal infections caused by viruses, bacteria, or parasites.  Food poisoning or food allergies.  Certain medicines, such as antibiotics, chemotherapy, and laxatives.  Artificial sweeteners and fructose.  Digestive disorders. HOME CARE INSTRUCTIONS  Ensure adequate fluid intake (hydration): Have 1 cup (8 oz) of fluid for each diarrhea episode. Avoid fluids that contain simple sugars or sports drinks, fruit juices, whole milk products, and sodas. Your urine should be clear or pale yellow if you are drinking enough fluids. Hydrate with an oral rehydration solution that you can purchase at pharmacies, retail stores, and online. You can prepare an oral rehydration solution at home by mixing the following ingredients together:   - tsp table salt.   tsp baking soda.   tsp salt substitute containing potassium chloride.  1  tablespoons sugar.  1 L (34 oz) of water.  Certain foods and beverages may increase the speed at which food moves through the gastrointestinal (GI) tract. These foods and beverages should be avoided and include:  Caffeinated and alcoholic  beverages.  High-fiber foods, such as raw fruits and vegetables, nuts, seeds, and whole grain breads and cereals.  Foods and beverages sweetened with sugar alcohols, such as xylitol, sorbitol, and mannitol.  Some foods may  be well tolerated and may help thicken stool including:  Starchy foods, such as rice, toast, pasta, low-sugar cereal, oatmeal, grits, baked potatoes, crackers, and bagels.  Bananas.  Applesauce.  Add probiotic-rich foods to help increase healthy bacteria in the GI tract, such as yogurt and fermented milk products.  Wash your hands well after each diarrhea episode.  Only take over-the-counter or prescription medicines as directed by your caregiver.  Take a warm bath to relieve any burning or pain from frequent diarrhea episodes. SEEK IMMEDIATE MEDICAL CARE IF:   You are unable to keep fluids down.  You have persistent vomiting.  You have blood in your stool, or your stools are black and tarry.  You do not urinate in 6-8 hours, or there is only a small amount of very dark urine.  You have abdominal pain that increases or localizes.  You have weakness, dizziness, confusion, or light-headedness.  You have a severe headache.  Your diarrhea gets worse or does not get better.  You have a fever or persistent symptoms for more than 2-3 days.  You have a fever and your symptoms suddenly get worse. MAKE SURE YOU:   Understand these instructions.  Will watch your condition.  Will get help right away if you are not doing well or get worse.   This information is not intended to replace advice given to you by your health care provider. Make sure you discuss any questions you have with your health care provider.   Document Released: 10/20/2002 Document Revised: 11/20/2014 Document Reviewed: 07/07/2012 Elsevier Interactive Patient Education 2016 Elsevier Inc.  Nausea and Vomiting Nausea is a sick feeling that often comes before throwing up (vomiting). Vomiting is a reflex where stomach contents come out of your mouth. Vomiting can cause severe loss of body fluids (dehydration). Children and elderly adults can become dehydrated quickly, especially if they also have diarrhea. Nausea  and vomiting are symptoms of a condition or disease. It is important to find the cause of your symptoms. CAUSES   Direct irritation of the stomach lining. This irritation can result from increased acid production (gastroesophageal reflux disease), infection, food poisoning, taking certain medicines (such as nonsteroidal anti-inflammatory drugs), alcohol use, or tobacco use.  Signals from the brain.These signals could be caused by a headache, heat exposure, an inner ear disturbance, increased pressure in the brain from injury, infection, a tumor, or a concussion, pain, emotional stimulus, or metabolic problems.  An obstruction in the gastrointestinal tract (bowel obstruction).  Illnesses such as diabetes, hepatitis, gallbladder problems, appendicitis, kidney problems, cancer, sepsis, atypical symptoms of a heart attack, or eating disorders.  Medical treatments such as chemotherapy and radiation.  Receiving medicine that makes you sleep (general anesthetic) during surgery. DIAGNOSIS Your caregiver may ask for tests to be done if the problems do not improve after a few days. Tests may also be done if symptoms are severe or if the reason for the nausea and vomiting is not clear. Tests may include:  Urine tests.  Blood tests.  Stool tests.  Cultures (to look for evidence of infection).  X-rays or other imaging studies. Test results can help your caregiver make decisions about treatment or the need for additional tests. TREATMENT You need to stay well hydrated. Drink frequently but in small amounts.You  may wish to drink water, sports drinks, clear broth, or eat frozen ice pops or gelatin dessert to help stay hydrated.When you eat, eating slowly may help prevent nausea.There are also some antinausea medicines that may help prevent nausea. HOME CARE INSTRUCTIONS   Take all medicine as directed by your caregiver.  If you do not have an appetite, do not force yourself to eat. However, you  must continue to drink fluids.  If you have an appetite, eat a normal diet unless your caregiver tells you differently.  Eat a variety of complex carbohydrates (rice, wheat, potatoes, bread), lean meats, yogurt, fruits, and vegetables.  Avoid high-fat foods because they are more difficult to digest.  Drink enough water and fluids to keep your urine clear or pale yellow.  If you are dehydrated, ask your caregiver for specific rehydration instructions. Signs of dehydration may include:  Severe thirst.  Dry lips and mouth.  Dizziness.  Dark urine.  Decreasing urine frequency and amount.  Confusion.  Rapid breathing or pulse. SEEK IMMEDIATE MEDICAL CARE IF:   You have blood or brown flecks (like coffee grounds) in your vomit.  You have black or bloody stools.  You have a severe headache or stiff neck.  You are confused.  You have severe abdominal pain.  You have chest pain or trouble breathing.  You do not urinate at least once every 8 hours.  You develop cold or clammy skin.  You continue to vomit for longer than 24 to 48 hours.  You have a fever. MAKE SURE YOU:   Understand these instructions.  Will watch your condition.  Will get help right away if you are not doing well or get worse.   This information is not intended to replace advice given to you by your health care provider. Make sure you discuss any questions you have with your health care provider.   Document Released: 10/30/2005 Document Revised: 01/22/2012 Document Reviewed: 03/29/2011 Elsevier Interactive Patient Education [employer]

## 2016-04-12 ENCOUNTER — Encounter: Payer: Self-pay | Admitting: Podiatry

## 2016-04-12 ENCOUNTER — Ambulatory Visit (INDEPENDENT_AMBULATORY_CARE_PROVIDER_SITE_OTHER): Payer: Managed Care, Other (non HMO)

## 2016-04-12 ENCOUNTER — Ambulatory Visit (INDEPENDENT_AMBULATORY_CARE_PROVIDER_SITE_OTHER): Payer: Managed Care, Other (non HMO) | Admitting: Podiatry

## 2016-04-12 VITALS — BP 140/81 | HR 99 | Resp 16

## 2016-04-12 DIAGNOSIS — M79673 Pain in unspecified foot: Secondary | ICD-10-CM | POA: Diagnosis not present

## 2016-04-12 DIAGNOSIS — E119 Type 2 diabetes mellitus without complications: Secondary | ICD-10-CM | POA: Diagnosis not present

## 2016-04-12 DIAGNOSIS — S92301A Fracture of unspecified metatarsal bone(s), right foot, initial encounter for closed fracture: Secondary | ICD-10-CM

## 2016-04-12 DIAGNOSIS — Z0189 Encounter for other specified special examinations: Secondary | ICD-10-CM

## 2016-04-12 NOTE — Progress Notes (Signed)
   Subjective:    Patient ID: Perry Macdonald, male    DOB: 07-Nov-1986, 30 y.o.   MRN: [id number]  HPI: He presents today with chief complaint pain to the lateral aspect of his right foot times the past 2-3 days. He denies any trauma. He is a Medical illustrator for [employer]. He states that the pain is sharp at times and radiates up the leg from the fifth met base. 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 He denies numbness and tingling in his toes and fingers.    Review of Systems  Musculoskeletal: Positive for myalgias, arthralgias and gait problem.  All other systems reviewed and are negative.      Objective:   Physical Exam: Vital signs are stable he is alert and oriented 3 presents with an antalgic gait right foot. No apparent distress. Very pleasant. Pulses are strong and palpable. Neurologic sensorium is intact. Deep tendon reflexes are intact. Muscle strength is normal and equal bilateral. Orthopedic evaluation demonstrates mild pes planus bilateral. He has pain on direct palpation fifth metatarsal proximally 1-1/2-2 cm from the base of the fifth met along the proximal diaphyseal region. Radiograph does demonstrate what appears to be a small cortical interruption in this area consistent with a stress fracture. Cutaneous evaluation and straight supple well-hydrated cutis no erythema edema saline as drainage or odor.        Assessment & Plan:  Stress fracture fifth met base right foot. Diabetes mellitus non-complicated.  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 Follow up with him in 1 month.

## 2016-05-10 ENCOUNTER — Encounter: Payer: Self-pay | Admitting: Podiatry

## 2016-05-10 ENCOUNTER — Ambulatory Visit (INDEPENDENT_AMBULATORY_CARE_PROVIDER_SITE_OTHER): Payer: Managed Care, Other (non HMO) | Admitting: Podiatry

## 2016-05-10 ENCOUNTER — Ambulatory Visit (INDEPENDENT_AMBULATORY_CARE_PROVIDER_SITE_OTHER): Payer: Managed Care, Other (non HMO)

## 2016-05-10 DIAGNOSIS — S92301D Fracture of unspecified metatarsal bone(s), right foot, subsequent encounter for fracture with routine healing: Secondary | ICD-10-CM

## 2016-05-11 NOTE — Progress Notes (Signed)
He presents today for follow-up of his endosteal stress reaction fifth metatarsal of the right foot. He states it is approximately 50-55% better. He continues to wear the Cam Walker. He continues to perform light duty at work.  Objective: Vital signs are stable alert and oriented 3. Pulses are palpable. Neurologic sensorium is intact. Deep tendon reflexes are intact. He has minimal tenderness on palpation of fifth metatarsal of the left foot nothing like last visit. There is no edema no erythema cellulitis drainage or odor no open wounds. Radiographs today demonstrate what appears to be in endosteal stress reaction with chondrosis to the proximal diaphysis of the fifth metatarsal base. No through and through fracture no bone callus is forming externally.  Assessment: Well-healing stress reaction fifth metatarsal base right.  Plan: I will him to get back to regular duty in 2 weeks however the next 2 weeks am requesting that he continue to utilize his Cam Walker.

## 2016-06-07 ENCOUNTER — Ambulatory Visit (INDEPENDENT_AMBULATORY_CARE_PROVIDER_SITE_OTHER): Payer: Managed Care, Other (non HMO)

## 2016-06-07 ENCOUNTER — Ambulatory Visit (INDEPENDENT_AMBULATORY_CARE_PROVIDER_SITE_OTHER): Payer: Managed Care, Other (non HMO) | Admitting: Podiatry

## 2016-06-07 ENCOUNTER — Ambulatory Visit: Payer: Managed Care, Other (non HMO) | Admitting: Podiatry

## 2016-06-07 ENCOUNTER — Encounter: Payer: Self-pay | Admitting: Podiatry

## 2016-06-07 DIAGNOSIS — S92301D Fracture of unspecified metatarsal bone(s), right foot, subsequent encounter for fracture with routine healing: Secondary | ICD-10-CM

## 2016-06-07 DIAGNOSIS — M722 Plantar fascial fibromatosis: Secondary | ICD-10-CM

## 2016-06-07 DIAGNOSIS — M205X1 Other deformities of toe(s) (acquired), right foot: Secondary | ICD-10-CM | POA: Diagnosis not present

## 2016-06-07 MED ORDER — MELOXICAM 15 MG PO TABS: 15.0000 mg | ORAL_TABLET | Freq: Every day | ORAL | 3 refills | Status: DC

## 2016-06-07 NOTE — Progress Notes (Signed)
He presents today for follow-up of his endosteal stress reaction fifth metatarsal on the right foot. He states that he is approximately 80% improved but he does have some tenderness in his big toe joint of his right foot. He states this is becoming more painful over the last few weeks.  Objective: Her signs are stable alert and oriented 3. He has no reproducible pain on palpation fifth metatarsal. He does have tenderness on N range of motion of the first metatarsophalangeal joint of the right foot.  Assessment: Mild pes planus resolving endosteal stress reaction fifth metatarsal right foot. 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  Plan: We discussed the need for orthotics. At this point he has not met his deductible. I placed him on meloxicam 15 mg 1 by mouth daily 30 days I provided him with 3 refills and I will follow-up with him as needed for orthotics.

## 2017-01-30 ENCOUNTER — Other Ambulatory Visit: Payer: Self-pay

## 2017-01-30 MED ORDER — MELOXICAM 15 MG PO TABS: 15.0000 mg | ORAL_TABLET | Freq: Every day | ORAL | 3 refills | Status: DC

## 2017-10-03 ENCOUNTER — Ambulatory Visit: Payer: Self-pay | Admitting: Physician Assistant

## 2017-10-03 DIAGNOSIS — Z021 Encounter for pre-employment examination: Secondary | ICD-10-CM

## 2017-10-03 NOTE — Progress Notes (Signed)
See systoc for documentation

## 2018-01-11 DIAGNOSIS — L853 Xerosis cutis: Secondary | ICD-10-CM | POA: Insufficient documentation

## 2018-04-01 ENCOUNTER — Ambulatory Visit: Payer: Self-pay

## 2018-04-01 VITALS — BP 152/90 | HR 92 | Resp 20 | Ht 75.0 in | Wt 370.0 lb

## 2018-04-01 DIAGNOSIS — Z0189 Encounter for other specified special examinations: Principal | ICD-10-CM

## 2018-04-01 DIAGNOSIS — Z008 Encounter for other general examination: Secondary | ICD-10-CM

## 2018-04-01 LAB — GLUCOSE, POCT (MANUAL RESULT ENTRY): POC Glucose: 172 mg/dl — AB (ref 70–99)

## 2018-04-01 NOTE — Progress Notes (Signed)
Lab visit only, did not see provider. Patient has upcoming physical exam/wellness visit for biometric screening with his PCP.

## 2018-04-02 LAB — LIPID PANEL
Chol/HDL Ratio: 4.8 ratio (ref 0.0–5.0)
Cholesterol, Total: 150 mg/dL (ref 100–199)
HDL: 31 mg/dL — ABNORMAL LOW (ref >39)
LDL Calculated: 56 mg/dL (ref 0–99)
Triglycerides: 315 mg/dL — ABNORMAL HIGH (ref 0–149)
VLDL Cholesterol Cal: 63 mg/dL — ABNORMAL HIGH (ref 5–40)

## 2018-04-02 NOTE — Progress Notes (Signed)
Carollee Herter, Will you call the patient and inform them that their lipid panel came back?  His total cholesterol, LDL cholesterol ("bad cholesterol"), and cholesterol/HDL ratio are all normal. The triglyceride level is elevated at 315, normal values are between 0 and 149.  The VLDL cholesterol is elevated at 63, normal values are between 5 and 40. The HDL cholesterol ("good cholesterol") is decreased at 31, normal values are above 39.  His nonfasting glucose is 172.  Please advise the patient to follow-up with their primary care provider regarding these results.

## 2018-05-20 ENCOUNTER — Other Ambulatory Visit: Payer: Self-pay | Admitting: Neurosurgery

## 2018-05-20 DIAGNOSIS — R1011 Right upper quadrant pain: Secondary | ICD-10-CM

## 2018-06-06 ENCOUNTER — Ambulatory Visit: Admission: RE | Admit: 2018-06-06 | Payer: Managed Care, Other (non HMO) | Source: Ambulatory Visit

## 2018-06-10 ENCOUNTER — Ambulatory Visit
Admission: RE | Admit: 2018-06-10 | Discharge: 2018-06-10 | Disposition: A | Discharge status: Home / Self Care | Payer: Managed Care, Other (non HMO) | Source: Ambulatory Visit | Attending: Neurosurgery | Admitting: Neurosurgery

## 2018-06-10 DIAGNOSIS — K760 Fatty (change of) liver, not elsewhere classified: Secondary | ICD-10-CM | POA: Insufficient documentation

## 2018-06-10 DIAGNOSIS — R1011 Right upper quadrant pain: Secondary | ICD-10-CM

## 2019-01-07 ENCOUNTER — Encounter: Payer: Self-pay | Admitting: Podiatry

## 2019-01-07 ENCOUNTER — Ambulatory Visit: Payer: Managed Care, Other (non HMO) | Admitting: Podiatry

## 2019-01-07 ENCOUNTER — Ambulatory Visit (INDEPENDENT_AMBULATORY_CARE_PROVIDER_SITE_OTHER): Payer: Managed Care, Other (non HMO)

## 2019-01-07 ENCOUNTER — Other Ambulatory Visit: Payer: Self-pay | Admitting: Podiatry

## 2019-01-07 DIAGNOSIS — M205X2 Other deformities of toe(s) (acquired), left foot: Secondary | ICD-10-CM

## 2019-01-07 DIAGNOSIS — M2141 Flat foot [pes planus] (acquired), right foot: Secondary | ICD-10-CM

## 2019-01-07 DIAGNOSIS — M2142 Flat foot [pes planus] (acquired), left foot: Secondary | ICD-10-CM

## 2019-01-07 DIAGNOSIS — M79672 Pain in left foot: Principal | ICD-10-CM

## 2019-01-07 DIAGNOSIS — M79671 Pain in right foot: Secondary | ICD-10-CM

## 2019-01-07 DIAGNOSIS — M7752 Other enthesopathy of left foot: Secondary | ICD-10-CM | POA: Diagnosis not present

## 2019-01-07 MED ORDER — MELOXICAM 15 MG PO TABS: 15.0000 mg | ORAL_TABLET | Freq: Every day | ORAL | 1 refills | Status: AC

## 2019-01-09 NOTE — Progress Notes (Signed)
   HPI: 33 year old male presenting today with a chief complaint of throbbing pain to the 1st MPJ of the left foot that began about 2 weeks ago. He reports associated stiffness of the joint. Walking increases the pain. He has been taking Tylenol Arthritis which helps alleviate the pain. He is interested in orthotics. He denies any known trauma or injury. Patient is here for further evaluation and treatment.   Past Medical History:  Diagnosis Date  . Diabetes mellitus without complication (HCC)   . Hypertension      Physical Exam: General: The patient is alert and oriented x3 in no acute distress.  Dermatology: Skin is warm, dry and supple bilateral lower extremities. Negative for open lesions or macerations.  Vascular: Palpable pedal pulses bilaterally. No edema or erythema noted. Capillary refill within normal limits.  Neurological: Epicritic and protective threshold grossly intact bilaterally.   Musculoskeletal Exam: Pain on palpation with limited range of motion noted to the first MPJ of the left foot.  Radiographic Exam: Degenerative changes noted with joint space narrowing first MPJ. There also appears to be extra-articular spurring noted about the joint.   Assessment: 1. 1st MPJ capsulitis / Hallux limitus left    Plan of Care:  1. Patient evaluated. X-Rays reviewed.  2. Injection of 0.5 mLs Celestone Soluspan injected into the 1st MPJ of the left foot.  3. Prescription for Meloxicam provided to patient. 4. Appointment with Raiford Noble, Pedorthist, for custom molded orthotics.  5. Return to clinic as needed.      Felecia Shelling, DPM [facility]  Dr. Felecia Shelling, DPM    [address] [place], [place] [address]                Office [phone]  Fax [phone]

## 2019-01-29 ENCOUNTER — Other Ambulatory Visit: Payer: Self-pay

## 2019-01-29 ENCOUNTER — Ambulatory Visit (INDEPENDENT_AMBULATORY_CARE_PROVIDER_SITE_OTHER): Payer: Managed Care, Other (non HMO) | Admitting: Orthotics

## 2019-01-29 DIAGNOSIS — M2142 Flat foot [pes planus] (acquired), left foot: Secondary | ICD-10-CM

## 2019-01-29 DIAGNOSIS — M2141 Flat foot [pes planus] (acquired), right foot: Secondary | ICD-10-CM

## 2019-01-29 DIAGNOSIS — M7752 Other enthesopathy of left foot: Secondary | ICD-10-CM

## 2019-01-29 DIAGNOSIS — M205X2 Other deformities of toe(s) (acquired), left foot: Secondary | ICD-10-CM

## 2019-01-29 NOTE — Progress Notes (Signed)
Patient is here today to be evaluated and cast for CMFO.  Patient has hx of functional hallux limitus (FHL), and needs a supportive orthoses that will plantarflex first ray in order to lower hinge pin of first MPJ and enhance windless effect.  Plan of deep heel cup, hug arch, and reverse mortons extension.  Richy to fab.  

## 2019-02-19 ENCOUNTER — Other Ambulatory Visit: Payer: Managed Care, Other (non HMO) | Admitting: Orthotics

## 2019-02-26 ENCOUNTER — Ambulatory Visit (INDEPENDENT_AMBULATORY_CARE_PROVIDER_SITE_OTHER): Payer: Managed Care, Other (non HMO) | Admitting: Orthotics

## 2019-02-26 ENCOUNTER — Other Ambulatory Visit: Payer: Self-pay

## 2019-02-26 DIAGNOSIS — M7752 Other enthesopathy of left foot: Secondary | ICD-10-CM

## 2019-02-26 DIAGNOSIS — M205X2 Other deformities of toe(s) (acquired), left foot: Secondary | ICD-10-CM

## 2019-02-26 DIAGNOSIS — M2142 Flat foot [pes planus] (acquired), left foot: Principal | ICD-10-CM

## 2019-02-26 DIAGNOSIS — M2141 Flat foot [pes planus] (acquired), right foot: Secondary | ICD-10-CM

## 2019-02-26 NOTE — Progress Notes (Signed)
Patient came in today to pick up custom made foot orthotics.  The goals were accomplished and the patient reported no dissatisfaction with said orthotics.  Patient was advised of breakin period and how to report any issues. 

## 2019-04-22 ENCOUNTER — Encounter: Payer: Self-pay | Admitting: Emergency Medicine

## 2019-04-22 ENCOUNTER — Emergency Department
Admission: EM | Admit: 2019-04-22 | Discharge: 2019-04-22 | Disposition: A | Discharge status: Home / Self Care | Payer: Managed Care, Other (non HMO) | Attending: Emergency Medicine | Admitting: Emergency Medicine

## 2019-04-22 ENCOUNTER — Emergency Department: Payer: Managed Care, Other (non HMO)

## 2019-04-22 ENCOUNTER — Other Ambulatory Visit: Payer: Self-pay

## 2019-04-22 DIAGNOSIS — E119 Type 2 diabetes mellitus without complications: Secondary | ICD-10-CM | POA: Insufficient documentation

## 2019-04-22 DIAGNOSIS — Z794 Long term (current) use of insulin: Secondary | ICD-10-CM | POA: Diagnosis not present

## 2019-04-22 DIAGNOSIS — I10 Essential (primary) hypertension: Secondary | ICD-10-CM | POA: Diagnosis not present

## 2019-04-22 DIAGNOSIS — Z79899 Other long term (current) drug therapy: Secondary | ICD-10-CM | POA: Diagnosis not present

## 2019-04-22 DIAGNOSIS — K5732 Diverticulitis of large intestine without perforation or abscess without bleeding: Secondary | ICD-10-CM | POA: Insufficient documentation

## 2019-04-22 DIAGNOSIS — R109 Unspecified abdominal pain: Secondary | ICD-10-CM | POA: Diagnosis present

## 2019-04-22 DIAGNOSIS — K5792 Diverticulitis of intestine, part unspecified, without perforation or abscess without bleeding: Secondary | ICD-10-CM

## 2019-04-22 LAB — COMPREHENSIVE METABOLIC PANEL
ALT: 34 U/L (ref 0–44)
AST: 23 U/L (ref 15–41)
Albumin: 4.4 g/dL (ref 3.5–5.0)
Alkaline Phosphatase: 37 U/L — ABNORMAL LOW (ref 38–126)
Anion gap: 9 (ref 5–15)
BUN: 12 mg/dL (ref 6–20)
CO2: 23 mmol/L (ref 22–32)
Calcium: 9 mg/dL (ref 8.9–10.3)
Chloride: 105 mmol/L (ref 98–111)
Creatinine, Ser: 0.91 mg/dL (ref 0.61–1.24)
GFR calc Af Amer: >60 mL/min (ref >60)
GFR calc non Af Amer: >60 mL/min (ref >60)
Glucose, Bld: 193 mg/dL — ABNORMAL HIGH (ref 70–99)
Potassium: 3.8 mmol/L (ref 3.5–5.1)
Sodium: 137 mmol/L (ref 135–145)
Total Bilirubin: 0.5 mg/dL (ref 0.3–1.2)
Total Protein: 7.6 g/dL (ref 6.5–8.1)

## 2019-04-22 LAB — CBC WITH DIFFERENTIAL/PLATELET
Abs Immature Granulocytes: 0.05 10*3/uL (ref 0.00–0.07)
Basophils Absolute: 0 10*3/uL (ref 0.0–0.1)
Basophils Relative: 0 %
Eosinophils Absolute: 0.1 10*3/uL (ref 0.0–0.5)
Eosinophils Relative: 1 %
HCT: 40.7 % (ref 39.0–52.0)
Hemoglobin: 13.7 g/dL (ref 13.0–17.0)
Immature Granulocytes: 0 %
Lymphocytes Relative: 17 %
Lymphs Abs: 2.2 10*3/uL (ref 0.7–4.0)
MCH: 26.7 pg (ref 26.0–34.0)
MCHC: 33.7 g/dL (ref 30.0–36.0)
MCV: 79.3 fL — ABNORMAL LOW (ref 80.0–100.0)
Monocytes Absolute: 0.9 10*3/uL (ref 0.1–1.0)
Monocytes Relative: 7 %
Neutro Abs: 10 10*3/uL — ABNORMAL HIGH (ref 1.7–7.7)
Neutrophils Relative %: 75 %
Platelets: 264 10*3/uL (ref 150–400)
RBC: 5.13 MIL/uL (ref 4.22–5.81)
RDW: 13.7 % (ref 11.5–15.5)
WBC: 13.4 10*3/uL — ABNORMAL HIGH (ref 4.0–10.5)
nRBC: 0 % (ref 0.0–0.2)

## 2019-04-22 LAB — URINALYSIS, COMPLETE (UACMP) WITH MICROSCOPIC
Bacteria, UA: NONE SEEN
Bilirubin Urine: NEGATIVE
Glucose, UA: 50 mg/dL — AB
Hgb urine dipstick: NEGATIVE
Ketones, ur: NEGATIVE mg/dL
Leukocytes,Ua: NEGATIVE
Nitrite: NEGATIVE
Protein, ur: NEGATIVE mg/dL
Specific Gravity, Urine: 1.013 (ref 1.005–1.030)
Squamous Epithelial / HPF: NONE SEEN (ref 0–5)
pH: 5 (ref 5.0–8.0)

## 2019-04-22 LAB — LIPASE, BLOOD: Lipase: 38 U/L (ref 11–51)

## 2019-04-22 MED ORDER — AMOXICILLIN-POT CLAVULANATE 875-125 MG PO TABS: 1.0000 | ORAL_TABLET | Freq: Two times a day (BID) | ORAL | 0 refills | Status: AC

## 2019-04-22 MED ORDER — IOHEXOL 300 MG/ML  SOLN
125.0000 mL | Freq: Once | INTRAMUSCULAR | Status: AC | PRN
  Administered 2019-04-22: 03:00:00 150 mL via INTRAVENOUS

## 2019-04-22 MED ORDER — METRONIDAZOLE 500 MG PO TABS: 500.0000 mg | ORAL_TABLET | Freq: Three times a day (TID) | ORAL | 0 refills | Status: AC

## 2019-04-22 MED ORDER — ACETAMINOPHEN 500 MG PO TABS
1000.0000 mg | ORAL_TABLET | Freq: Once | ORAL | Status: AC
  Administered 2019-04-22: 1000 mg via ORAL
  Filled 2019-04-22: qty 2

## 2019-04-22 MED ORDER — OXYCODONE-ACETAMINOPHEN 5-325 MG PO TABS: 1.0000 | ORAL_TABLET | ORAL | 0 refills | Status: DC | PRN

## 2019-04-22 MED ORDER — SODIUM CHLORIDE 0.9 % IV BOLUS
1000.0000 mL | Freq: Once | INTRAVENOUS | Status: AC
  Administered 2019-04-22: 05:00:00 1000 mL via INTRAVENOUS

## 2019-04-22 MED ORDER — ONDANSETRON 4 MG PO TBDP: 4.0000 mg | ORAL_TABLET | Freq: Three times a day (TID) | ORAL | 0 refills | Status: DC | PRN

## 2019-04-22 MED ORDER — PIPERACILLIN-TAZOBACTAM 3.375 G IVPB 30 MIN
3.3750 g | Freq: Once | INTRAVENOUS | Status: AC
  Administered 2019-04-22: 3.375 g via INTRAVENOUS

## 2019-04-22 NOTE — ED Triage Notes (Addendum)
Patient ambulatory to triage with steady gait, without difficulty or distress noted; pt reports mid lower abd pain several days, just beneath umbilical area, intermittent and sharp accomp by nausea and chills--tenderness with palpation; denies hx of same; tylenol taken PTA

## 2019-04-22 NOTE — ED Notes (Signed)
Pt sitting in recliner in subwait with no distress noted; pt updated on plan of care, voices good understanding

## 2019-04-22 NOTE — ED Provider Notes (Signed)
[facility] Emergency Department Provider Note  ____________________________________________  Time seen: Approximately 5:12 AM  I have reviewed the triage vital signs and the nursing notes.   HISTORY  Chief Complaint Abdominal Pain   HPI Perry Macdonald is a 33 y.o. male with history of diabetes and hypertension who presents for evaluation of abdominal pain.  Patient reports 3 days of lower abdominal pain that has been intermittent, sharp, associated with nausea, chills, and fever.  Patient denies any prior abdominal surgeries.  Pain is moderate in intensity.  No dysuria hematuria, no vomiting, no diarrhea.  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 Authorizing Provider  amLODipine (NORVASC) 5 MG tablet Take 5 mg by mouth daily.    [provider]  amoxicillin-clavulanate (AUGMENTIN) 875-125 MG tablet Take 1 tablet by mouth 2 (two) times daily for 10 days. 04/22/19 05/02/19  Nita SickleVeronese, [place], MD  atorvastatin (LIPITOR) 80 MG tablet  01/27/16   [provider]  B-D UF III MINI PEN NEEDLES 31G X 5 MM MISC  03/03/16   [provider]  insulin aspart protamine- aspart (NOVOLOG MIX 70/30) (70-30) 100 UNIT/ML injection Inject 80 Units into the skin daily with breakfast.     [provider]  insulin aspart protamine- aspart (NOVOLOG MIX 70/30) (70-30) 100 UNIT/ML injection Inject 50 Units into the skin  daily with supper.    [provider]  lisinopril (PRINIVIL,ZESTRIL) 40 MG tablet Take 40 mg by mouth daily.    [provider]  metFORMIN (GLUCOPHAGE) 1000 MG tablet  01/22/16   [provider]  metroNIDAZOLE (FLAGYL) 500 MG tablet Take 1 tablet (500 mg total) by mouth 3 (three) times daily for 10 days. 04/22/19 05/02/19  Nita SickleVeronese, [place], MD  olmesartan [facility](BENICAR) 40 MG tablet  01/03/19   [provider]  ondansetron (ZOFRAN ODT) 4 MG disintegrating tablet Take 1 tablet (4 mg total) by mouth every 8 (eight) hours as needed. 04/22/19   Nita SickleVeronese, [place], MD  oxyCODONE-acetaminophen (PERCOCET) 5-325 MG tablet Take 1 tablet by mouth every 4 (four) hours as needed. 04/22/19   Nita SickleVeronese, , MD  predniSONE (DELTASONE) 10 MG tablet Take 4 po daily x 5 days 12/27/18   [provider]  VICTOZA 18 MG/3ML SOPN  04/05/16   [provider]    Allergies Bee venom; Lisinopril; Sulfa antibiotics; and Sulfur  No family history on file.  Social History Social History   Tobacco Use   Smoking status: Never Smoker   Smokeless tobacco: Never Used  Substance Use Topics   Alcohol use: Yes    Comment: Occ. Drinking   Drug use: No    Review of Systems  Constitutional: Negative for fever. + chills Eyes: Negative for visual changes. ENT: Negative for sore throat. Neck: No neck pain  Cardiovascular: Negative for chest pain. Respiratory: Negative for shortness of breath. Gastrointestinal: + lower abdominal pain and nausea. No vomiting or diarrhea.  Genitourinary: Negative for dysuria. Musculoskeletal: Negative for back pain. Skin: Negative for rash. Neurological: Negative for headaches, weakness or numbness. Psych: No SI or HI  ____________________________________________   PHYSICAL EXAM:  VITAL SIGNS: ED Triage Vitals  Enc Vitals Group     BP 04/22/19 0136 (!) 152/71     Pulse Rate 04/22/19 0136 (!) 114     Resp 04/22/19 0136 18      Temp 04/22/19 0136 99.1 F (37.3 C)     Temp Source 04/22/19 0136 Oral     SpO2 04/22/19 0136 100 %     Weight 04/22/19 0136 (!) 366 lb (166 kg)     Height 04/22/19 0136 6\' 3"  (1.905 m)     Head Circumference --      Peak Flow --      Pain Score 04/22/19 0135 10     Pain Loc --      Pain Edu? --      Excl. in [place]? --     Constitutional: Alert and oriented. Well appearing and in no apparent distress. HEENT:      Head: Normocephalic and atraumatic.         Eyes: Conjunctivae are normal. Sclera is non-icteric.       Mouth/Throat: Mucous membranes are moist.       Neck: Supple with no signs of meningismus. Cardiovascular: Regular rate and rhythm. No murmurs, gallops, or rubs. 2+ symmetrical distal pulses are present in all extremities. No JVD. Respiratory: Normal respiratory effort. Lungs are clear to auscultation bilaterally. No wheezes, crackles, or rhonchi.  Gastrointestinal: Obese, LLQ tenderness to palpation, non distended with positive bowel sounds. No rebound or guarding. Musculoskeletal: Nontender with normal range of motion in all extremities. No edema, cyanosis, or erythema of extremities. Neurologic: Normal speech and language. Face is symmetric. Moving all extremities. No gross focal neurologic deficits are appreciated. Skin: Skin is warm, dry and intact. No rash noted. Psychiatric: Mood and affect are normal. Speech and behavior are normal.  ____________________________________________   LABS (all labs ordered are listed, but only abnormal results are displayed)  Labs Reviewed  CBC WITH DIFFERENTIAL/PLATELET - Abnormal; Notable for the following components:      Result Value   WBC 13.4 (*)    MCV 79.3 (*)    Neutro Abs 10.0 (*)    All other components within normal limits  COMPREHENSIVE METABOLIC PANEL - Abnormal; Notable for the following components:   Glucose, Bld 193 (*)    Alkaline Phosphatase 37 (*)    All other components within normal limits  URINALYSIS,  COMPLETE (UACMP) WITH MICROSCOPIC - Abnormal; Notable for the following components:   Color, Urine YELLOW (*)    APPearance CLEAR (*)    Glucose, UA 50 (*)    All other components within normal limits  LIPASE, BLOOD   ____________________________________________  EKG  none  ____________________________________________  RADIOLOGY  I have personally reviewed the images performed during this visit and I agree with the Radiologist's read.   Interpretation by Radiologist:  Ct Abdomen Pelvis W Contrast  Result Date: 04/22/2019 CLINICAL DATA:  33 year old male with several days of abdominal pain, infraumbilical pain. EXAM: CT ABDOMEN AND PELVIS WITH CONTRAST TECHNIQUE: Multidetector CT imaging of the abdomen and pelvis was performed using the standard protocol following bolus administration of intravenous contrast. CONTRAST:  150mL OMNIPAQUE IOHEXOL 300 MG/ML  SOLN COMPARISON:  Abdomen ultrasound 06/10/2018. FINDINGS: Lower chest: Negative. Hepatobiliary: Evidence of hepatic steatosis. Areas of fatty sparing suspected near the  gallbladder fossa and in the left lobe. Negative gallbladder. Pancreas: Negative. Spleen: Negative. Adrenals/Urinary Tract: Normal adrenal glands. Renal enhancement appears symmetric and within normal limits. No hydronephrosis. Negative ureters. Unremarkable urinary bladder. Stomach/Bowel: Negative rectum aside from retained stool. At the junction of the descending and sigmoid colon there is a 5.5 centimeter area of mesenteric stranding surrounding an area of diverticulosis, and associated with circumferential large bowel wall thickening up to 12 millimeters. No extraluminal gas. No free fluid. Upstream the descending colon appears normal. Negative transverse colon, right colon, appendix, and terminal ileum. No dilated small bowel. Negative stomach. No free air. No abdominal free fluid. Vascular/Lymphatic: Suboptimal intravascular contrast bolus but the major arterial structures  appear patent. Portal venous system appears to be patent. Reproductive: Negative. Other: No pelvic free fluid. Musculoskeletal: No acute osseous abnormality identified. IMPRESSION: 1. Positive for acute diverticulitis and/or colitis at the junction of the descending and sigmoid colon. No abscess or complicating features. 2. Hepatic steatosis. Electronically Signed   By: Genevie Ann M.D.   On: 04/22/2019 03:23     ____________________________________________   PROCEDURES  Procedure(s) performed: None Procedures Critical Care performed:  None ____________________________________________   INITIAL IMPRESSION / ASSESSMENT AND PLAN / ED COURSE  33 y.o. male with history of diabetes and hypertension who presents for evaluation of abdominal pain.  CT concerning for acute diverticulitis with no abscess or perforation.  Patient with a temp of 99.73F and will slightly tachycardic with a pulse of 114.  Normotensive.  Patient has white count of 13.4.  Mildly elevated glucose with no evidence of DKA.  Patient was given IV fluids, Tylenol, and Zosyn.  Repeat vitals showed improvement in patient's HR.  Patient be discharged home on Augmentin and Flagyl, close follow-up with PCP.  Discussed my standard return precautions for worsening pain, fever or vomiting.      As part of my medical decision making, I reviewed the following data within the [place] notes reviewed and incorporated, Labs reviewed , Old chart reviewed, Radiograph reviewed , Notes from prior ED visits and [place] Controlled Substance Database    Pertinent labs & imaging results that were available during my care of the patient were reviewed by me and considered in my medical decision making (see chart for details).    ____________________________________________   FINAL CLINICAL IMPRESSION(S) / ED DIAGNOSES  Final diagnoses:  Diverticulitis      NEW MEDICATIONS STARTED DURING THIS VISIT:  ED Discharge Orders          Ordered    metroNIDAZOLE (FLAGYL) 500 MG tablet  3 times daily     04/22/19 0516    ondansetron (ZOFRAN ODT) 4 MG disintegrating tablet  Every 8 hours PRN     04/22/19 0516    amoxicillin-clavulanate (AUGMENTIN) 875-125 MG tablet  2 times daily     04/22/19 0516    oxyCODONE-acetaminophen (PERCOCET) 5-325 MG tablet  Every 4 hours PRN     04/22/19 0516           Note:  This document was prepared using Dragon voice recognition software and may include unintentional dictation errors.    Alfred Levins, [place], MD 04/22/19 [phone]

## 2019-04-23 ENCOUNTER — Encounter: Payer: Self-pay | Admitting: Emergency Medicine

## 2019-04-23 ENCOUNTER — Emergency Department
Admission: EM | Admit: 2019-04-23 | Discharge: 2019-04-23 | Disposition: A | Discharge status: Home / Self Care | Payer: Managed Care, Other (non HMO) | Attending: Emergency Medicine | Admitting: Emergency Medicine

## 2019-04-23 ENCOUNTER — Other Ambulatory Visit: Payer: Self-pay

## 2019-04-23 ENCOUNTER — Emergency Department
Admission: EM | Admit: 2019-04-23 | Discharge: 2019-04-23 | Disposition: A | Discharge status: Home / Self Care | Payer: Managed Care, Other (non HMO) | Source: Home / Self Care | Attending: Emergency Medicine | Admitting: Emergency Medicine

## 2019-04-23 DIAGNOSIS — Z794 Long term (current) use of insulin: Secondary | ICD-10-CM | POA: Insufficient documentation

## 2019-04-23 DIAGNOSIS — R112 Nausea with vomiting, unspecified: Secondary | ICD-10-CM | POA: Insufficient documentation

## 2019-04-23 DIAGNOSIS — Z20828 Contact with and (suspected) exposure to other viral communicable diseases: Secondary | ICD-10-CM | POA: Insufficient documentation

## 2019-04-23 DIAGNOSIS — Z79899 Other long term (current) drug therapy: Secondary | ICD-10-CM | POA: Insufficient documentation

## 2019-04-23 DIAGNOSIS — E119 Type 2 diabetes mellitus without complications: Secondary | ICD-10-CM | POA: Diagnosis not present

## 2019-04-23 DIAGNOSIS — K5732 Diverticulitis of large intestine without perforation or abscess without bleeding: Secondary | ICD-10-CM | POA: Insufficient documentation

## 2019-04-23 DIAGNOSIS — R109 Unspecified abdominal pain: Secondary | ICD-10-CM | POA: Diagnosis present

## 2019-04-23 DIAGNOSIS — I10 Essential (primary) hypertension: Secondary | ICD-10-CM | POA: Insufficient documentation

## 2019-04-23 DIAGNOSIS — K5792 Diverticulitis of intestine, part unspecified, without perforation or abscess without bleeding: Secondary | ICD-10-CM

## 2019-04-23 LAB — CBC WITH DIFFERENTIAL/PLATELET
Abs Immature Granulocytes: 0.05 10*3/uL (ref 0.00–0.07)
Basophils Absolute: 0 10*3/uL (ref 0.0–0.1)
Basophils Relative: 0 %
Eosinophils Absolute: 0 10*3/uL (ref 0.0–0.5)
Eosinophils Relative: 0 %
HCT: 38.6 % — ABNORMAL LOW (ref 39.0–52.0)
Hemoglobin: 12.8 g/dL — ABNORMAL LOW (ref 13.0–17.0)
Immature Granulocytes: 0 %
Lymphocytes Relative: 10 %
Lymphs Abs: 1.3 10*3/uL (ref 0.7–4.0)
MCH: 26.4 pg (ref 26.0–34.0)
MCHC: 33.2 g/dL (ref 30.0–36.0)
MCV: 79.6 fL — ABNORMAL LOW (ref 80.0–100.0)
Monocytes Absolute: 0.9 10*3/uL (ref 0.1–1.0)
Monocytes Relative: 7 %
Neutro Abs: 11.1 10*3/uL — ABNORMAL HIGH (ref 1.7–7.7)
Neutrophils Relative %: 83 %
Platelets: 239 10*3/uL (ref 150–400)
RBC: 4.85 MIL/uL (ref 4.22–5.81)
RDW: 13.8 % (ref 11.5–15.5)
WBC: 13.5 10*3/uL — ABNORMAL HIGH (ref 4.0–10.5)
nRBC: 0 % (ref 0.0–0.2)

## 2019-04-23 LAB — URINALYSIS, COMPLETE (UACMP) WITH MICROSCOPIC
Bacteria, UA: NONE SEEN
Bilirubin Urine: NEGATIVE
Glucose, UA: NEGATIVE mg/dL
Hgb urine dipstick: NEGATIVE
Ketones, ur: NEGATIVE mg/dL
Nitrite: NEGATIVE
Protein, ur: NEGATIVE mg/dL
Specific Gravity, Urine: 1.016 (ref 1.005–1.030)
Squamous Epithelial / HPF: NONE SEEN (ref 0–5)
pH: 5 (ref 5.0–8.0)

## 2019-04-23 LAB — LIPASE, BLOOD: Lipase: 29 U/L (ref 11–51)

## 2019-04-23 LAB — COMPREHENSIVE METABOLIC PANEL
ALT: 36 U/L (ref 0–44)
AST: 18 U/L (ref 15–41)
Albumin: 4 g/dL (ref 3.5–5.0)
Alkaline Phosphatase: 36 U/L — ABNORMAL LOW (ref 38–126)
Anion gap: 9 (ref 5–15)
BUN: 8 mg/dL (ref 6–20)
CO2: 25 mmol/L (ref 22–32)
Calcium: 8.7 mg/dL — ABNORMAL LOW (ref 8.9–10.3)
Chloride: 104 mmol/L (ref 98–111)
Creatinine, Ser: 1.15 mg/dL (ref 0.61–1.24)
GFR calc Af Amer: >60 mL/min (ref >60)
GFR calc non Af Amer: >60 mL/min (ref >60)
Glucose, Bld: 185 mg/dL — ABNORMAL HIGH (ref 70–99)
Potassium: 3.1 mmol/L — ABNORMAL LOW (ref 3.5–5.1)
Sodium: 138 mmol/L (ref 135–145)
Total Bilirubin: 0.7 mg/dL (ref 0.3–1.2)
Total Protein: 7.9 g/dL (ref 6.5–8.1)

## 2019-04-23 MED ORDER — ONDANSETRON 4 MG PO TBDP
4.0000 mg | ORAL_TABLET | Freq: Once | ORAL | Status: AC
  Administered 2019-04-23: 4 mg via ORAL
  Filled 2019-04-23: qty 1

## 2019-04-23 MED ORDER — MORPHINE SULFATE (PF) 4 MG/ML IV SOLN
4.0000 mg | Freq: Once | INTRAVENOUS | Status: AC
  Administered 2019-04-23: 4 mg via INTRAMUSCULAR
  Filled 2019-04-23: qty 1

## 2019-04-23 MED ORDER — AMOXICILLIN-POT CLAVULANATE 875-125 MG PO TABS
1.0000 | ORAL_TABLET | Freq: Once | ORAL | Status: AC
  Administered 2019-04-23: 1 via ORAL
  Filled 2019-04-23: qty 1

## 2019-04-23 MED ORDER — PROMETHAZINE HCL 25 MG PO TABS: 25.0000 mg | ORAL_TABLET | Freq: Four times a day (QID) | ORAL | 0 refills | Status: DC | PRN

## 2019-04-23 NOTE — ED Triage Notes (Signed)
Pt in via EMS from home with abd pain. EMS reports pt dx'd Tuesday with diverticulitis. Pt also with NV, temp 104 oral, CBG 141, BP 112/80, HR 130, 96% RA.

## 2019-04-23 NOTE — ED Provider Notes (Signed)
[facility] Emergency Department Provider Note  Time seen: 4:37 PM  I have reviewed the triage vital signs and the nursing notes.   HISTORY  Chief Complaint Abdominal Pain   HPI Perry Macdonald is a 33 y.o. male a past medical history of diabetes, hypertension, obesity, presents to the emergency department for abdominal pain nausea and vomiting.  According to the patient he was seen in the emergency department  yesterday was diagnosed with diverticulitis.  Patient was put on antibiotics, pain medication.  Patient states he was too nauseated today to keep down his antibiotics or pain medication, so he came back to the emergency department for evaluation.  Patient states he has had low-grade fever at home.  Denies any change in the abdominal pain from yesterday to today.  Denies any diarrhea.  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  Past Medical History:  Diagnosis Date  . Diabetes mellitus without complication (HCC)   . Hypertension     Patient Active Problem List   Diagnosis Date Noted  . Morbid (severe) obesity due to excess calories (HCC) 12/27/2015  . 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 Type 2 diabetes mellitus (HCC) 04/17/2012  . Diabetes mellitus type 2 in obese (HCC) 07/21/2011  . BP (high blood pressure) 07/21/2011    Past Surgical History:  Procedure Laterality Date  . ARTHROSCOPIC REPAIR ACL Right     Prior to Admission medications   Medication Sig Start Date End Date Taking? Authorizing Provider  amLODipine (NORVASC) 5 MG tablet Take 5 mg by mouth daily.    [provider]  amoxicillin-clavulanate (AUGMENTIN) 875-125 MG tablet Take 1 tablet by mouth 2 (two) times daily for 10 days. 04/22/19 05/02/19  Nita SickleVeronese, [place], MD  atorvastatin (LIPITOR) 80 MG tablet  01/27/16   [provider]  B-D UF III MINI PEN NEEDLES 31G  X 5 MM MISC  03/03/16   [provider]  insulin aspart protamine- aspart (NOVOLOG MIX 70/30) (70-30) 100 UNIT/ML injection Inject 80 Units into the skin daily with breakfast.     [provider]  insulin aspart protamine- aspart (NOVOLOG MIX 70/30) (70-30) 100 UNIT/ML injection Inject 50 Units into the skin daily with supper.    [provider]  lisinopril (PRINIVIL,ZESTRIL) 40 MG tablet Take 40 mg by mouth daily.    [provider]  metFORMIN (GLUCOPHAGE) 1000 MG tablet  01/22/16   [provider]  metroNIDAZOLE (FLAGYL) 500 MG tablet Take 1 tablet (500 mg total) by mouth 3 (three) times daily for 10 days. 04/22/19 05/02/19  Nita SickleVeronese, [place], MD  olmesartan [facility](BENICAR) 40 MG tablet  01/03/19   [provider]  ondansetron (ZOFRAN ODT) 4 MG disintegrating tablet Take 1 tablet (4 mg total) by mouth every 8 (eight) hours as needed. 04/22/19   Nita SickleVeronese, [place], MD  oxyCODONE-acetaminophen (PERCOCET) 5-325 MG tablet Take 1 tablet by mouth every 4 (four) hours as needed. 04/22/19   Nita SickleVeronese, [place], MD  predniSONE (DELTASONE) 10 MG tablet Take 4 po daily x 5 days 12/27/18   [provider]  VICTOZA 18 MG/3ML SOPN  04/05/16   [provider]    Allergies  Allergen Reactions  . Bee Venom Swelling  . Lisinopril Swelling  . Sulfa Antibiotics Hives  . Sulfur Hives    No family history on file.  Social History Social History   Tobacco Use  .  Smoking status: Never Smoker  . Smokeless tobacco: Never Used  Substance Use Topics  . Alcohol use: Yes    Comment: Occ. Drinking  . Drug use: No    Review of Systems Constitutional: Negative for fever. ENT: Negative for recent illness/congestion Cardiovascular: Negative for chest pain. Respiratory: Negative for shortness of breath.  Negative for cough. Gastrointestinal: Positive for abdominal pain mostly in the right lower quadrant.  Positive for nausea vomiting.  Negative for  diarrhea. Musculoskeletal: Negative for musculoskeletal complaints Skin: Negative for skin complaints  Neurological: Negative for headache All other ROS negative  ____________________________________________   PHYSICAL EXAM:  VITAL SIGNS: ED Triage Vitals  Enc Vitals Group     BP 04/23/19 1403 (!) 141/79     Pulse Rate 04/23/19 1403 (!) 122     Resp 04/23/19 1403 20     Temp 04/23/19 1403 99.6 F (37.6 C)     Temp Source 04/23/19 1403 Oral     SpO2 04/23/19 1403 94 %     Weight 04/23/19 1400 (!) 366 lb (166 kg)     Height 04/23/19 1400 6\' 3"  (1.905 m)     Head Circumference --      Peak Flow --      Pain Score 04/23/19 1400 10     Pain Loc --      Pain Edu? --      Excl. in [place]? --     Constitutional: Alert and oriented. Well appearing and in no distress. Eyes: Normal exam ENT      Head: Normocephalic and atraumatic.      Mouth/Throat: Mucous membranes are moist. Cardiovascular: Normal rate, regular rhythm.  Respiratory: Normal respiratory effort without tachypnea nor retractions. Breath sounds are clear  Gastrointestinal: Soft, mild to moderate lower abdominal tenderness.  No rebound guarding or distention. Musculoskeletal: Nontender with normal range of motion in all extremities.  Neurologic:  Normal speech and language. No gross focal neurologic deficits  Skin:  Skin is warm, dry and intact.  Psychiatric: Mood and affect are normal.   ____________________________________________     RADIOLOGY  CT shows descending/sigmoid diverticulitis yesterday.  ____________________________________________   INITIAL IMPRESSION / ASSESSMENT AND PLAN / ED COURSE  Pertinent labs & imaging results that were available during my care of the patient were reviewed by me and considered in my medical decision making (see chart for details).   Patient presents emergency department for continued lower abdominal pain now with nausea vomiting today after attempting to take his pain  medication.  Patient denies any significant worsening of the abdominal pain although denies any improvement either.  Patient has tenderness across the lower abdomen.  CT scan performed yesterday consistent with acute diverticulitis.  No findings on exam to suggest acute perforation.  Patient's lab work is largely unchanged from prior.  We will dose pain medication, nausea medication and re-dose the patient's antibiotics.  Patient is also concerned that he could have contracted coronavirus as he states he has been feeling very weak today and he works at a prison.  We will perform a send out COVID test for the patient.  As long the patient is able to tolerate his oral medications anticipate likely discharge home with continued supportive care at home.  Patient agreeable to plan of care, discussed return precautions as well as dietary precautions.  Patient agreeable.  Aldahir Litaker Whittington was evaluated in Emergency Department on 04/23/2019 for the symptoms described in the history of present illness. He was evaluated in  the context of the global COVID-19 pandemic, which necessitated consideration that the patient might be at risk for infection with the SARS-CoV-2 virus that causes COVID-19. Institutional protocols and algorithms that pertain to the evaluation of patients at risk for COVID-19 are in a state of rapid change based on information released by regulatory bodies including the CDC and federal and state organizations. These policies and algorithms were followed during the patient's care in the ED.  ____________________________________________   FINAL CLINICAL IMPRESSION(S) / ED DIAGNOSES  Diverticulitis   Minna AntisPaduchowski, Brigitta Pricer, MD 04/23/19 1641

## 2019-04-23 NOTE — ED Triage Notes (Signed)
Pt reports dx'd with diverticulitis Monday and he came last night and waited for 5 hours before he left without being seen. Pt states pain is still here and worse and states thinks he has a fever now.

## 2019-04-23 NOTE — ED Triage Notes (Signed)
Pt states that Tuesday he was dx with diverticulitis in the ED and given medication - Since then the pain has increased

## 2019-04-23 NOTE — ED Triage Notes (Signed)
Pt stated on abx on Tuesday, called his MD and she told him to come to the ED.

## 2019-04-25 LAB — NOVEL CORONAVIRUS, NAA (HOSP ORDER, SEND-OUT TO REF LAB; TAT 18-24 HRS): SARS-CoV-2, NAA: NOT DETECTED

## 2019-05-21 ENCOUNTER — Other Ambulatory Visit: Payer: Self-pay

## 2019-05-21 MED ORDER — MELOXICAM 15 MG PO TABS: 15.0000 mg | ORAL_TABLET | Freq: Every day | ORAL | 3 refills | Status: DC

## 2019-05-21 NOTE — Telephone Encounter (Signed)
Pharmacy refill request for Meloxicam 15mg   Per Dr. Evans verbal order, ok to refill.   Script has been sent to pharmacy 

## 2019-07-10 ENCOUNTER — Emergency Department: Payer: Managed Care, Other (non HMO)

## 2019-07-10 ENCOUNTER — Other Ambulatory Visit: Payer: Self-pay

## 2019-07-10 ENCOUNTER — Encounter: Payer: Self-pay | Admitting: Emergency Medicine

## 2019-07-10 ENCOUNTER — Ambulatory Visit: Payer: Managed Care, Other (non HMO) | Admitting: Adult Health

## 2019-07-10 ENCOUNTER — Encounter: Payer: Self-pay | Admitting: Adult Health

## 2019-07-10 ENCOUNTER — Emergency Department
Admission: EM | Admit: 2019-07-10 | Discharge: 2019-07-10 | Disposition: A | Discharge status: Home / Self Care | Payer: Managed Care, Other (non HMO) | Attending: Emergency Medicine | Admitting: Emergency Medicine

## 2019-07-10 DIAGNOSIS — Z794 Long term (current) use of insulin: Secondary | ICD-10-CM | POA: Diagnosis not present

## 2019-07-10 DIAGNOSIS — Y939 Activity, unspecified: Secondary | ICD-10-CM | POA: Diagnosis not present

## 2019-07-10 DIAGNOSIS — R059 Cough, unspecified: Secondary | ICD-10-CM

## 2019-07-10 DIAGNOSIS — R05 Cough: Secondary | ICD-10-CM | POA: Diagnosis not present

## 2019-07-10 DIAGNOSIS — Z8719 Personal history of other diseases of the digestive system: Secondary | ICD-10-CM | POA: Diagnosis not present

## 2019-07-10 DIAGNOSIS — Z20828 Contact with and (suspected) exposure to other viral communicable diseases: Secondary | ICD-10-CM | POA: Diagnosis not present

## 2019-07-10 DIAGNOSIS — Z20822 Contact with and (suspected) exposure to covid-19: Secondary | ICD-10-CM

## 2019-07-10 DIAGNOSIS — Z79899 Other long term (current) drug therapy: Secondary | ICD-10-CM | POA: Diagnosis not present

## 2019-07-10 DIAGNOSIS — Z7189 Other specified counseling: Secondary | ICD-10-CM

## 2019-07-10 DIAGNOSIS — M25521 Pain in right elbow: Secondary | ICD-10-CM | POA: Diagnosis present

## 2019-07-10 DIAGNOSIS — I10 Essential (primary) hypertension: Secondary | ICD-10-CM | POA: Diagnosis not present

## 2019-07-10 DIAGNOSIS — R197 Diarrhea, unspecified: Secondary | ICD-10-CM | POA: Diagnosis not present

## 2019-07-10 DIAGNOSIS — M7021 Olecranon bursitis, right elbow: Secondary | ICD-10-CM | POA: Diagnosis not present

## 2019-07-10 DIAGNOSIS — E119 Type 2 diabetes mellitus without complications: Secondary | ICD-10-CM | POA: Insufficient documentation

## 2019-07-10 MED ORDER — IBUPROFEN 400 MG PO TABS
400.0000 mg | ORAL_TABLET | Freq: Once | ORAL | Status: AC | PRN
  Administered 2019-07-10: 400 mg via ORAL
  Filled 2019-07-10: qty 1

## 2019-07-10 MED ORDER — MELOXICAM 15 MG PO TABS: 15.0000 mg | ORAL_TABLET | Freq: Every day | ORAL | 0 refills | Status: DC

## 2019-07-10 MED ORDER — HYDROCODONE-ACETAMINOPHEN 5-325 MG PO TABS: 1.0000 | ORAL_TABLET | ORAL | 0 refills | Status: DC | PRN

## 2019-07-10 MED ORDER — ONDANSETRON 8 MG PO TBDP
8.0000 mg | ORAL_TABLET | Freq: Once | ORAL | Status: AC
  Filled 2019-07-10: qty 1

## 2019-07-10 MED ORDER — MORPHINE SULFATE (PF) 4 MG/ML IV SOLN
4.0000 mg | Freq: Once | INTRAVENOUS | Status: AC
  Filled 2019-07-10: qty 1

## 2019-07-10 MED ORDER — KETOROLAC TROMETHAMINE 30 MG/ML IJ SOLN
30.0000 mg | Freq: Once | INTRAMUSCULAR | Status: AC
  Administered 2019-07-10: 30 mg via INTRAMUSCULAR
  Filled 2019-07-10: qty 1

## 2019-07-10 NOTE — Progress Notes (Signed)
Virtual Visit via Telephone Note  I connected with Perry Macdonald on 07/10/19 at  1:30 PM EDT by telephone and verified that I am speaking with the correct person using two identifiers.  Location: Patient: at home Provider: [facility], [place], [place]     I discussed the limitations, risks, security and privacy concerns of performing an evaluation and management service by telephone and the availability of in person appointments. I also discussed with the patient that there may be a patient responsible charge related to this service. The patient expressed understanding and agreed to proceed.   History of Present Illness: Patient is a 33 year old male in no acute distress to cause the clinic telephone visit during the COVID-19 pandemic office protocol. He has weakness, diarrhea started early this morning 07/10/19 and he has had diarrhea x 10 episodes today he denies any diarrhea in the last 2 hours.  Denies any blood, mucous, or tarry stools.  He feels mildly nauseated.  He denies any abdominal pain.  Mild body aches. Afebrile per patient.  Mild cough. Productive cough yellow clear started onset last night 07/09/19. He has some nasal congestion.   Patient  denies any chills, rash, chest pain, shortness of breath, vomiting.   He has not taken any over the counter medication He reports his blood sugar has been 107 fasting today.Denies any shortness of breath. Dizziness or lightheadedness are both denied.  He is insulin dependent diabetes.   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   Allergies  Allergen Reactions  . Bee Venom Swelling  . Lisinopril Swelling  . Sulfa Antibiotics Hives  . Sulfur Hives   He does have a recent history on 04/23/2019 of being seen in the emergency room and was seen on 04/22/2019 for diverticulitis seen on CT scan he was treated with Flagyl and Augmentin.  He reports he has been  doing well and he has not had a follow up with Covid. He reports he is not feeling like he did then now.    He was tested on yesterday and he had no symptoms at that time.  He is able to eat or drink.  He is concerned related to his exposure to COVID virus and being at home with his family, he is social distancing from them according himself in a separate room.  He is wearing a mask if he has to go into the bathroom and use a different bathroom from the other family He denies any other ill contacts in his home. Denies any other symptoms at this time. Observations/Objective:  Patient is alert and oriented and responsive to questions Engages in conversation with provider. Speaks in full sentences without any pauses without any shortness of breath or distress.   Patient is able to speak 15+ words without any distress heard over the phone.  No pauses while speaking.  He is pleasant on the phone and laughs and engages in conversation with the provider when questions are asked.  He also talks when questions are not asked and is very pleasant on the phone. Assessment and Plan:   ICD-10-CM   1. Diarrhea, unspecified type  R19.7 Novel Coronavirus, NAA ([facility])  2. History of diverticulitis  Z87.19 Novel Coronavirus, NAA ([facility])  3. Cough  R05 Novel Coronavirus, NAA ([facility])  4. Close Exposure to Covid-19 Virus  Z20.828   5. Educated About Covid-19 Virus Infection  Z71.89  Follow Up Instructions:   Will place an order for patient to be [place] tested on 07/14/2019 since this test was performed yesterday before he had any symptoms at his work.  Discussed in depth with patient that his history of diverticulitis must be taken into consideration and that if his diarrhea persist or he develops any abdominal pain he should seek an emergency evaluation for in person.  He reports that this is completely different than what he had in the past.  Discussed the bland diet and not taking any Imodium or  antidiarrheal medications unless diarrhea starts up again and he needs something.  Monitor temperature daily and report any new or unusual symptoms to the facility during office hours a day for 30 Monday through Friday if after hours patient should seek in person evaluation. I discussed the assessment and treatment plan with the patient. The patient was provided an opportunity to ask questions and all were answered. The patient agreed with the plan and demonstrated an understanding of the instructions.   The patient was advised to call back or seek an in-person evaluation if the symptoms worsen or if the condition fails to improve as anticipated.  Monitor blood sugars closely while sick and report any abnormals to your primary care provider.  Seek emergency medical attention if needed immediately, call 911 for emergencies Advised patient call the office or your primary care doctor for an appointment if no improvement within 72 hours or if any symptoms change or worsen at any time  Advised ER or urgent Care if after hours or on weekend. Call 911 for emergency symptoms at any time.Patinet verbalized understanding of all instructions given/reviewed and treatment plan and has no further questions or concerns at this time.    I provided 21 minutes of non-face-to-face time during this encounter.   Marcille Buffy, FNP

## 2019-07-10 NOTE — ED Provider Notes (Signed)
[facility] Emergency Department Provider Note  ____________________________________________  Time seen: Approximately 11:30 PM  I have reviewed the triage vital signs and the nursing notes.   HISTORY  Chief Complaint Elbow Pain    HPI Demarious Yousef is a 33 y.o. male who presents the emergency department complaining of right elbow pain with radicular symptoms from the elbow to the hand.  Patient reports that he woke up this evening with sharp, stabbing pain to the elbow.  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  Patient reports that he has had no history of gout.  No history of similar symptoms.  He denies any neck or shoulder pain.  No medications prior to arrival.         Past Medical History:  Diagnosis Date  . Diabetes mellitus without complication (HCC)   . Hypertension     Patient Active Problem List   Diagnosis Date Noted  . Dry skin dermatitis 01/11/2018  . Morbid (severe) obesity due to excess calories (HCC) 12/27/2015  . 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 Type 2 diabetes mellitus (HCC) 04/17/2012  . Diabetes mellitus type 2 in obese (HCC) 07/21/2011  . BP (high blood pressure) 07/21/2011    Past Surgical History:  Procedure Laterality Date  . ARTHROSCOPIC REPAIR ACL Right     Prior to Admission medications   Medication Sig Start Date End Date Taking? Authorizing Provider  amLODipine (NORVASC) 5 MG tablet Take 5 mg by mouth daily.    [provider]  B-D UF III MINI PEN NEEDLES 31G X 5 MM MISC  03/03/16   [provider]  busPIRone (BUSPAR) 7.5 MG tablet Take by mouth. 04/18/19 04/17/20  [provider]  famotidine (PEPCID) 20 MG tablet  01/27/19   [provider]  hydrochlorothiazide (HYDRODIURIL) 25 MG tablet  01/27/19   [provider]  HYDROcodone-acetaminophen (NORCO/VICODIN) 5-325 MG tablet Take 1 tablet by mouth every 4 (four) hours as  needed for moderate pain. 07/10/19   Cuthriell, Delorise Royals, PA-C  Insulin Isophane & Regular Human (HUMULIN 70/30 KWIKPEN) (70-30) 100 UNIT/ML PEN Inject 60 units with breakfast and 60 units with dinner 05/20/19   [provider]  Insulin Pen Needle (FIFTY50 PEN NEEDLES) 31G X 5 MM MISC as directed 04/04/19   [provider]  meloxicam (MOBIC) 15 MG tablet Take 1 tablet (15 mg total) by mouth daily. 05/21/19   Felecia Shelling, DPM  meloxicam (MOBIC) 15 MG tablet Take 1 tablet (15 mg total) by mouth daily. 07/10/19   Cuthriell, Delorise Royals, PA-C  metFORMIN (GLUCOPHAGE) 1000 MG tablet  01/22/16   [provider]  olmesartan (BENICAR) 40 MG tablet  01/03/19   [provider]  ondansetron (ZOFRAN ODT) 4 MG disintegrating tablet Take 1 tablet (4 mg total) by mouth every 8 (eight) hours as needed. 04/22/19   Nita Sickle, MD  pravastatin (PRAVACHOL) 10 MG tablet Take by mouth. 04/21/19 04/20/20  [provider]  predniSONE (DELTASONE) 10 MG tablet Take 4 po daily x 5 days 12/27/18   [provider]  VICTOZA 18 MG/3ML SOPN  04/05/16   [provider]    Allergies Lisinopril, Bee venom, and Sulfa antibiotics  No family history on file.  Social History Social History   Tobacco Use  . Smoking status: Never Smoker  . Smokeless tobacco: Never Used  Substance Use Topics  . Alcohol use: Yes  . Drug use: No  Review of Systems  Constitutional: No fever/chills Eyes: No visual changes. No discharge ENT: No upper respiratory complaints. Cardiovascular: no chest pain. Respiratory: no cough. No SOB. Gastrointestinal: No abdominal pain.  No nausea, no vomiting.   Musculoskeletal: Positive for pain to the right elbow Skin: Negative for rash, abrasions, lacerations, ecchymosis. Neurological: Negative for headaches, focal weakness or numbness. 10-point ROS otherwise negative.  ____________________________________________   PHYSICAL  EXAM:  VITAL SIGNS: ED Triage Vitals  Enc Vitals Group     BP 07/10/19 2133 (!) 150/69     Pulse Rate 07/10/19 2133 100     Resp 07/10/19 2133 18     Temp 07/10/19 2133 98.1 F (36.7 C)     Temp Source 07/10/19 2133 Oral     SpO2 07/10/19 2133 100 %     Weight 07/10/19 2133 (!) 361 lb (163.7 kg)     Height 07/10/19 2133 6\' 3"  (1.905 m)     Head Circumference --      Peak Flow --      Pain Score 07/10/19 2137 10     Pain Loc --      Pain Edu? --      Excl. in GC? --      Constitutional: Alert and oriented. Well appearing and in no acute distress. Eyes: Conjunctivae are normal. PERRL. EOMI. Head: Atraumatic. Neck: No stridor.    Cardiovascular: Normal rate, regular rhythm. Normal S1 and S2.  Good peripheral circulation. Respiratory: Normal respiratory effort without tachypnea or retractions. Lungs CTAB. Good air entry to the bases with no decreased or absent breath sounds. Musculoskeletal: Full range of motion to all extremities. No gross deformities appreciated.  Visualization of the right elbow reveals mild edema about the olecranon process.  Palpation reveals minimal ballottement over the olecranon process.  No other visible abnormality to the right elbow.  Area is not warm to palpation.  No erythema.  Patient is able to extend, flex, supinate and pronate the forearm.  Radial pulse intact distally.  Sensation intact and equal to unaffected extremity. Neurologic:  Normal speech and language. No gross focal neurologic deficits are appreciated.  Skin:  Skin is warm, dry and intact. No rash noted. Psychiatric: Mood and affect are normal. Speech and behavior are normal. Patient exhibits appropriate insight and judgement.   ____________________________________________   LABS (all labs ordered are listed, but only abnormal results are displayed)  Labs Reviewed - No data to  display ____________________________________________  EKG   ____________________________________________  RADIOLOGY I personally viewed and evaluated these images as part of my medical decision making, as well as reviewing the written report by the radiologist.  Dg Elbow Complete Right  Result Date: 07/10/2019 CLINICAL DATA:  33 year old male with right elbow pain. No known injury. EXAM: RIGHT ELBOW - COMPLETE 3+ VIEW COMPARISON:  None. FINDINGS: There is no evidence of fracture, dislocation, or joint effusion. There is no evidence of arthropathy or other focal bone abnormality. Soft tissues are unremarkable. IMPRESSION: Negative. Electronically Signed   By: Elgie CollardArash  Radparvar M.D.   On: 07/10/2019 22:11    ____________________________________________    PROCEDURES  Procedure(s) performed:    Procedures    Medications  ketorolac (TORADOL) 30 MG/ML injection 30 mg (has no administration in time range)  morphine 4 MG/ML injection 4 mg (has no administration in time range)  ondansetron (ZOFRAN-ODT) disintegrating tablet 8 mg (has no administration in time range)  ibuprofen (ADVIL) tablet 400 mg (400 mg Oral Given 07/10/19 2144)  ____________________________________________   INITIAL IMPRESSION / ASSESSMENT AND PLAN / ED COURSE  Pertinent labs & imaging results that were available during my care of the patient were reviewed by me and considered in my medical decision making (see chart for details).  Review of the [place] CSRS was performed in accordance of the [place] prior to dispensing any controlled drugs.           Patient's diagnosis is consistent with olecranon bursitis.  Patient presented to emergency department complaining of sharp posterior right elbow pain without trauma.  Imaging reveals no acute findings.  Exam is most consistent with olecranon bursitis.  Differential included cervical radiculopathy, fracture, dislocation, septic joint, arthritis, ligament injury,  bursitis, gout. Patient will be discharged home with prescriptions for Vicodin and meloxicam. Patient is to follow up with primary care or orthopedics as needed or otherwise directed. Patient is given ED precautions to return to the ED for any worsening or new symptoms.     ____________________________________________  FINAL CLINICAL IMPRESSION(S) / ED DIAGNOSES  Final diagnoses:  Olecranon bursitis of right elbow      NEW MEDICATIONS STARTED DURING THIS VISIT:  ED Discharge Orders         Ordered    meloxicam (MOBIC) 15 MG tablet  Daily     07/10/19 2341    HYDROcodone-acetaminophen (NORCO/VICODIN) 5-325 MG tablet  Every 4 hours PRN     07/10/19 2341              This chart was dictated using voice recognition software/Dragon. Despite best efforts to proofread, errors can occur which can change the meaning. Any change was purely unintentional.    Darletta Moll, PA-C 07/10/19 2341    Carrie Mew, MD 07/14/19 1515

## 2019-07-10 NOTE — Patient Instructions (Signed)
Advised patient call the office or your primary care doctor for an appointment if no improvement within 72 hours or if any symptoms change or worsen at any time  Advised ER or urgent Care if after hours or on weekend. Call 911 for emergency symptoms at any time.Patinet verbalized understanding of all instructions given/reviewed and treatment plan and has no further questions or concerns at this time.    Given your history of diverticulitis in June please proceed to the nearest emergency room should any of your symptoms persist or worsen or become consistent with what you had previously in June, if any symptoms change or worsen please do not hesitate to have a in-person evaluation immediately. Be sure that you are hydrated and try the bland diet as listed below. You should go for COVID testing on Monday 07/14/19.  Cough, Adult A cough helps to clear your throat and lungs. A cough may be a sign of an illness or another medical condition. An acute cough may only last 2-3 weeks, while a chronic cough may last 8 or more weeks. Many things can cause a cough. They include:  Germs (viruses or bacteria) that attack the airway.  Breathing in things that bother (irritate) your lungs.  Allergies.  Asthma.  Mucus that runs down the back of your throat (postnasal drip).  Smoking.  Acid backing up from the stomach into the tube that moves food from the mouth to the stomach (gastroesophageal reflux).  Some medicines.  Lung problems.  Other medical conditions, such as heart failure or a blood clot in the lung (pulmonary embolism). Follow these instructions at home: Medicines  Take over-the-counter and prescription medicines only as told by your doctor.  Talk with your doctor before you take medicines that stop a cough (coughsuppressants). Lifestyle   Do not smoke, and try not to be around smoke. Do not use any products that contain nicotine or tobacco, such as cigarettes, e-cigarettes, and  chewing tobacco. If you need help quitting, ask your doctor.  Drink enough fluid to keep your pee (urine) pale yellow.  Avoid caffeine.  Do not drink alcohol if your doctor tells you not to drink. General instructions   Watch for any changes in your cough. Tell your doctor about them.  Always cover your mouth when you cough.  Stay away from things that make you cough, such as perfume, candles, campfire smoke, or cleaning products.  If the air is dry, use a cool mist vaporizer or humidifier in your home.  If your cough is worse at night, try using extra pillows to raise your head up higher while you sleep.  Rest as needed.  Keep all follow-up visits as told by your doctor. This is important. Contact a doctor if:  You have new symptoms.  You cough up pus.  Your cough does not get better after 2-3 weeks, or your cough gets worse.  Cough medicine does not help your cough and you are not sleeping well.  You have pain that gets worse or pain that is not helped with medicine.  You have a fever.  You are losing weight and you do not know why.  You have night sweats. Get help right away if:  You cough up blood.  You have trouble breathing.  Your heartbeat is very fast. These symptoms may be an emergency. Do not wait to see if the symptoms will go away. Get medical help right away. Call your local emergency services (911 in the [place] Do not  drive yourself to the hospital. Summary  A cough helps to clear your throat and lungs. Many things can cause a cough.  Take over-the-counter and prescription medicines only as told by your doctor.  Always cover your mouth when you cough.  Contact a doctor if you have new symptoms or you have a cough that does not get better or gets worse. This information is not intended to replace advice given to you by your health care provider. Make sure you discuss any questions you have with your health care provider. Document Released:  07/13/2011 Document Revised: 11/18/2018 Document Reviewed: 11/18/2018 Elsevier Patient Education  2020 [employer] Diabetes Mellitus and Sick Day Management Blood sugar (glucose) can be difficult to control when you are sick. Common illnesses that can cause problems for people with diabetes (diabetes mellitus) include colds, fever, flu (influenza), nausea, vomiting, and diarrhea. These illnesses can cause stress and loss of body fluids (dehydration), and those issues can cause blood glucose levels to increase. Because of this, it is very important to take your insulin and diabetes medicines and eat some form of carbohydrate when you are sick. You should make a plan for days when you are sick (sick day plan) as part of your diabetes management plan. You and your health care provider should make this plan in advance. The following guidelines are intended to help you manage an illness that lasts for about 24 hours or less. Your health care provider may also give you more specific instructions. What do I need to do to manage my blood glucose?   Check your blood glucose every 2-4 hours, or as often as told by your health care provider.  Know your sick day treatment goals. Your target blood glucose levels may be different when you are sick.  If you use insulin, take your usual dose. ? If your blood glucose continues to be too high, you may need to take an additional insulin dose as told by your health care provider.  If you use oral diabetes medicine, you may need to stop taking it if you are not able to eat or drink normally. Ask your health care provider about whether you need to stop taking these medicines while you are sick.  If you use injectable hormone medicines other than insulin to control your diabetes, ask your health care provider about whether you need to stop taking these medicines while you are sick. What else can I do to manage my diabetes when I am sick? Check your ketones  If you  have type 1 diabetes, check your urine ketones every 4 hours.  If you have type 2 diabetes, check your urine ketones as often as told by your health care provider. Drink fluids  Drink enough fluid to keep your urine clear or pale yellow. This is especially important if you have a fever, vomiting, or diarrhea. Those symptoms can lead to dehydration.  Follow any instructions from your health care provider about beverages to avoid. ? Do not drink alcohol, caffeine, or drinks that contain a lot of sugar. Take medicines as directed  Take-over-the-counter and prescription medicines only as told by your health care provider.  Check medicine labels for added sugars. Some medicines may contain sugar or types of sugars that can raise your blood glucose level. What foods can I eat when I am sick?  You need to eat some form of carbohydrates when you are sick. You should eat 45-50 grams (45-50 g) of carbohydrates every 3-4 hours until  you feel better. All of the food choices below contain about 15 g of carbohydrates. Plan ahead and keep some of these foods around so you have them if you get sick.  4-6 oz (120-177 mL) carbonated beverage that contains sugar, such as regular (not diet) soda. You may be able to drink carbonated beverages more easily if you open the beverage and let it sit at room temperature for a few minutes before drinking.   of a twin frozen ice pop.  4 oz (120 g) regular gelatin.  4 oz (120 mL) fruit juice.  4 oz (120 g) ice cream or frozen yogurt.  2 oz (60 g) sherbet.  8 oz (240 mL) clear broth or soup.  4 oz (120 g) regular custard.  4 oz (120 g) regular pudding.  8 oz (240 g) plain yogurt.  1 slice bread or toast.  6 saltine crackers.  5 vanilla wafers. Questions to ask your health care provider Consider asking the following questions so you know what to do on days when you are sick:  Should I adjust my diabetes medicines?  How often do I need to check my  blood glucose?  What supplies do I need to manage my diabetes at home when I am sick?  What number can I call if I have questions?  What foods and drinks should I avoid? Contact a health care provider if:  You develop symptoms of diabetic ketoacidosis, such as: ? Fatigue. ? Weight loss. ? Excessive thirst. ? Light-headedness. ? Fruity or sweet-smelling breath. ? Excessive urination. ? Vision changes. ? Confusion or irritability. ? Nausea. ? Vomiting. ? Rapid breathing. ? Pain in the abdomen. ? Feeling flushed.  You are unable to drink fluids without vomiting.  You have any of the following for more than 6 hours: ? Nausea. ? Vomiting. ? Diarrhea.  Your blood glucose is at or above 240 mg/dL (16.113.3 mmol/L), even after you take an additional insulin dose.  You have a change in how you think, feel, or act (mental status).  You develop another serious illness.  You have been sick or have had a fever for 2 days or longer and you are not getting better. Get help right away if:  Your blood glucose is lower than 54 mg/dL (3.0 mmol/L).  You have difficulty breathing.  You have moderate or high ketone levels in your urine.  You used emergency glucagon to treat low blood glucose. Summary  Blood sugar (glucose) can be difficult to control when you are sick. Common illnesses that can cause problems for people with diabetes (diabetes mellitus) include colds, fever, flu (influenza), nausea, vomiting, and diarrhea.  Illnesses can cause stress and loss of body fluids (dehydration), and those issues can cause blood glucose levels to increase.  Make a plan for days when you are sick (sick day plan) as part of your diabetes management plan. You and your health care provider should make this plan in advance.  It is very important to take your insulin and diabetes medicines and to eat some form of carbohydrate when you are sick.  Contact your health care provider if have problems  managing your blood glucose levels when you are sick, or if you have been sick or had a fever for 2 days or longer and are not getting better. This information is not intended to replace advice given to you by your health care provider. Make sure you discuss any questions you have with your health care provider. Document  Released: 11/02/2003 Document Revised: 07/28/2016 Document Reviewed: 07/28/2016 Elsevier Patient Education  2020 [employer]. [place] Diet A bland diet consists of foods that are often soft and do not have a lot of fat, fiber, or extra seasonings. Foods without fat, fiber, or seasoning are easier for the body to digest. They are also less likely to irritate your mouth, throat, stomach, and other parts of your digestive system. A bland diet is sometimes called a BRAT diet. What is my plan? Your health care provider or food and nutrition specialist (dietitian) may recommend specific changes to your diet to prevent symptoms or to treat your symptoms. These changes may include:  Eating small meals often.  Cooking food until it is soft enough to chew easily.  Chewing your food well.  Drinking fluids slowly.  Not eating foods that are very spicy, sour, or fatty.  Not eating citrus fruits, such as oranges and grapefruit. What do I need to know about this diet?  Eat a variety of foods from the bland diet food list.  Do not follow a bland diet longer than needed.  Ask your health care provider whether you should take vitamins or supplements. What foods can I eat? Grains  Hot cereals, such as cream of wheat. Rice. Bread, crackers, or tortillas made from refined white flour. Vegetables Canned or cooked vegetables. Mashed or boiled potatoes. Fruits  Bananas. Applesauce. Other types of cooked or canned fruit with the skin and seeds removed, such as canned peaches or pears. Meats and other proteins  Scrambled eggs. Creamy peanut butter or other nut butters. Lean, well-cooked  meats, such as chicken or fish. Tofu. Soups or broths. Dairy Low-fat dairy products, such as milk, cottage cheese, or yogurt. Beverages  Water. Herbal tea. Apple juice. Fats and oils Mild salad dressings. [place]ola or olive oil. Sweets and desserts Pudding. Custard. Fruit gelatin. Ice cream. The items listed above may not be a complete list of recommended foods and beverages. Contact a dietitian for more options. What foods are not recommended? Grains Whole grain breads and cereals. Vegetables Raw vegetables. Fruits Raw fruits, especially citrus, berries, or dried fruits. Dairy Whole fat dairy foods. Beverages Caffeinated drinks. Alcohol. Seasonings and condiments Strongly flavored seasonings or condiments. Hot sauce. Salsa. Other foods Spicy foods. Fried foods. Sour foods, such as pickled or fermented foods. Foods with high sugar content. Foods high in fiber. The items listed above may not be a complete list of foods and beverages to avoid. Contact a dietitian for more information. Summary  A bland diet consists of foods that are often soft and do not have a lot of fat, fiber, or extra seasonings.  Foods without fat, fiber, or seasoning are easier for the body to digest.  Check with your health care provider to see how long you should follow this diet plan. It is not meant to be followed for long periods. This information is not intended to replace advice given to you by your health care provider. Make sure you discuss any questions you have with your health care provider. Document Released: 02/21/2016 Document Revised: 11/28/2017 Document Reviewed: 11/28/2017 Elsevier Patient Education  2020 [employer]. Food Choices to Help Relieve Diarrhea, Adult When you have diarrhea, the foods you eat and your eating habits are very important. Choosing the right foods and drinks can help:  Relieve diarrhea.  Replace lost fluids and nutrients.  Prevent dehydration. What general  guidelines should I follow?  Relieving diarrhea  Choose foods with less than 2  g or .07 oz. of fiber per serving.  Limit fats to less than 8 tsp (38 g or 1.34 oz.) a day.  Avoid the following: ? Foods and beverages sweetened with high-fructose corn syrup, honey, or sugar alcohols such as xylitol, sorbitol, and mannitol. ? Foods that contain a lot of fat or sugar. ? Fried, greasy, or spicy foods. ? High-fiber grains, breads, and cereals. ? Raw fruits and vegetables.  Eat foods that are rich in probiotics. These foods include dairy products such as yogurt and fermented milk products. They help increase healthy bacteria in the stomach and intestines (gastrointestinal tract, or GI tract).  If you have lactose intolerance, avoid dairy products. These may make your diarrhea worse.  Take medicine to help stop diarrhea (antidiarrheal medicine) only as told by your health care provider. Replacing nutrients  Eat small meals or snacks every 3-4 hours.  Eat bland foods, such as white rice, toast, or baked potato, until your diarrhea starts to get better. Gradually reintroduce nutrient-rich foods as tolerated or as told by your health care provider. This includes: ? Well-cooked protein foods. ? Peeled, seeded, and soft-cooked fruits and vegetables. ? Low-fat dairy products.  Take vitamin and mineral supplements as told by your health care provider. Preventing dehydration  Start by sipping water or a special solution to prevent dehydration (oral rehydration solution, ORS). Urine that is clear or pale yellow means that you are getting enough fluid.  Try to drink at least 8-10 cups of fluid each day to help replace lost fluids.  You may add other liquids in addition to water, such as clear juice or decaffeinated sports drinks, as tolerated or as told by your health care provider.  Avoid drinks with caffeine, such as coffee, tea, or soft drinks.  Avoid alcohol. What foods are recommended?      The items listed may not be a complete list. Talk with your health care provider about what dietary choices are best for you. Grains White rice. White, [place], or pita breads (fresh or toasted), including plain rolls, buns, or bagels. White pasta. Saltine, soda, or graham crackers. Pretzels. Low-fiber cereal. Cooked cereals made with water (such as cornmeal, farina, or cream cereals). Plain muffins. Matzo. Melba toast. Zwieback. Vegetables Potatoes (without the skin). Most well-cooked and canned vegetables without skins or seeds. Tender lettuce. Fruits Apple sauce. Fruits canned in juice. Cooked apricots, cherries, grapefruit, peaches, pears, or plums. Fresh bananas and cantaloupe. Meats and other protein foods Baked or boiled chicken. Eggs. Tofu. Fish. Seafood. Smooth nut butters. Ground or well-cooked tender beef, ham, veal, lamb, pork, or poultry. Dairy Plain yogurt, kefir, and unsweetened liquid yogurt. Lactose-free milk, buttermilk, skim milk, or soy milk. Low-fat or nonfat hard cheese. Beverages Water. Low-calorie sports drinks. Fruit juices without pulp. Strained tomato and vegetable juices. Decaffeinated teas. Sugar-free beverages not sweetened with sugar alcohols. Oral rehydration solutions, if approved by your health care provider. Seasoning and other foods Bouillon, broth, or soups made from recommended foods. What foods are not recommended? The items listed may not be a complete list. Talk with your health care provider about what dietary choices are best for you. Grains Whole grain, whole wheat, bran, or rye breads, rolls, pastas, and crackers. Wild or brown rice. Whole grain or bran cereals. Barley. Oats and oatmeal. Corn tortillas or taco shells. Granola. Popcorn. Vegetables Raw vegetables. Fried vegetables. Cabbage, broccoli, Brussels sprouts, artichokes, baked beans, beet greens, corn, kale, legumes, peas, sweet potatoes, and yams. Potato skins. Cooked spinach and  cabbage.  Fruits Dried fruit, including raisins and dates. Raw fruits. Stewed or dried prunes. Canned fruits with syrup. Meat and other protein foods Fried or fatty meats. Deli meats. Chunky nut butters. Nuts and seeds. Beans and lentils. Tomasa Blase. Hot dogs. Sausage. Dairy High-fat cheeses. Whole milk, chocolate milk, and beverages made with milk, such as milk shakes. Half-and-half. Cream. sour cream. Ice cream. Beverages Caffeinated beverages (such as coffee, tea, soda, or energy drinks). Alcoholic beverages. Fruit juices with pulp. Prune juice. Soft drinks sweetened with high-fructose corn syrup or sugar alcohols. High-calorie sports drinks. Fats and oils Butter. Cream sauces. Margarine. Salad oils. Plain salad dressings. Olives. Avocados. Mayonnaise. Sweets and desserts Sweet rolls, doughnuts, and sweet breads. Sugar-free desserts sweetened with sugar alcohols such as xylitol and sorbitol. Seasoning and other foods Honey. Hot sauce. Chili powder. Gravy. Cream-based or milk-based soups. Pancakes and waffles. Summary  When you have diarrhea, the foods you eat and your eating habits are very important.  Make sure you get at least 8-10 cups of fluid each day, or enough to keep your urine clear or pale yellow.  Eat bland foods and gradually reintroduce healthy, nutrient-rich foods as tolerated, or as told by your health care provider.  Avoid high-fiber, fried, greasy, or spicy foods. This information is not intended to replace advice given to you by your health care provider. Make sure you discuss any questions you have with your health care provider. Document Released: 01/20/2004 Document Revised: 02/20/2019 Document Reviewed: 10/27/2016 Elsevier Patient Education  2020 Elsevier Inc. Diarrhea, Adult Diarrhea is when you pass loose and watery poop (stool) often. Diarrhea can make you feel weak and cause you to lose water in your body (get dehydrated). Losing water in your body can cause you to:  Feel  tired and thirsty.  Have a dry mouth.  Go pee (urinate) less often. Diarrhea often lasts 2-3 days. However, it can last longer if it is a sign of something more serious. It is important to treat your diarrhea as told by your doctor. Follow these instructions at home: Eating and drinking     Follow these instructions as told by your doctor:  Take an ORS (oral rehydration solution). This is a drink that helps you replace fluids and minerals your body lost. It is sold at pharmacies and stores.  Drink plenty of fluids, such as: ? Water. ? Ice chips. ? Diluted fruit juice. ? Low-calorie sports drinks. ? Milk, if you want.  Avoid drinking fluids that have a lot of sugar or caffeine in them.  Eat bland, easy-to-digest foods in small amounts as you are able. These foods include: ? Bananas. ? Applesauce. ? Rice. ? Low-fat (lean) meats. ? Toast. ? Crackers.  Avoid alcohol.  Avoid spicy or fatty foods.  Medicines  Take over-the-counter and prescription medicines only as told by your doctor.  If you were prescribed an antibiotic medicine, take it as told by your doctor. Do not stop using the antibiotic even if you start to feel better. General instructions   Wash your hands often using soap and water. If soap and water are not available, use a hand sanitizer. Others in your home should wash their hands as well. Hands should be washed: ? After using the toilet or changing a diaper. ? Before preparing, cooking, or serving food. ? While caring for a sick person. ? While visiting someone in a hospital.  Drink enough fluid to keep your pee (urine) pale yellow.  Rest at home while you  get better.  Watch your condition for any changes.  Take a warm bath to help with any burning or pain from having diarrhea.  Keep all follow-up visits as told by your doctor. This is important. Contact a doctor if:  You have a fever.  Your diarrhea gets worse.  You have new symptoms.   You cannot keep fluids down.  You feel light-headed or dizzy.  You have a headache.  You have muscle cramps. Get help right away if:  You have chest pain.  You feel very weak or you pass out (faint).  You have bloody or black poop or poop that looks like tar.  You have very bad pain, cramping, or bloating in your belly (abdomen).  You have trouble breathing or you are breathing very quickly.  Your heart is beating very quickly.  Your skin feels cold and clammy.  You feel confused.  You have signs of losing too much water in your body, such as: ? Dark pee, very little pee, or no pee. ? Cracked lips. ? Dry mouth. ? Sunken eyes. ? Sleepiness. ? Weakness. Summary  Diarrhea is when you pass loose and watery poop (stool) often.  Diarrhea can make you feel weak and cause you to lose water in your body (get dehydrated).  Take an ORS (oral rehydration solution). This is a drink that is sold at pharmacies and stores.  Eat bland, easy-to-digest foods in small amounts as you are able.  Contact a doctor if your condition gets worse. Get help right away if you have signs that you have lost too much water in your body. This information is not intended to replace advice given to you by your health care provider. Make sure you discuss any questions you have with your health care provider. Document Released: 04/17/2008 Document Revised: 04/05/2018 Document Reviewed: 04/05/2018 Elsevier Patient Education  2020 [employer]. Hello Perry Macdonald,  You are being placed in the home monitoring program for COVID-19 (commonly known as Coronavirus).  This is because you are suspected to have the virus or are known to have the virus.  If you are unsure which group you fall into call your clinic.    As part of this program, you'll answer a daily questionnaire in the MyChart mobile app. You'll receive a notification through the MyChart app when the questionnaire is available. When you log in to MyChart,  you'll see the tasks in your To Do activity.       Clinicians will see any answers that are concerning and take appropriate steps.  If at any point you are having a medical emergency, call 911.  If otherwise concerned call your clinic instead of coming into the clinic or hospital.  To keep from spreading the disease you should: Stay home and limit contact with other people as much as possible.  Wash your hands frequently. Cover your coughs and sneezes with a tissue, and throw used tissues in the trash.   Clean and disinfect frequently touched surfaces and objects.    Take care of yourself by: Staying home Resting Drinking fluids Take fever-reducing medications (Tylenol/Acetaminophen and Ibuprofen)  For more information on the disease go to the Centers for Disease Control and Prevention website   COVID-19 Frequently Asked Questions COVID-19 (coronavirus disease) is an infection that is caused by a large family of viruses. Some viruses cause illness in people and others cause illness in animals like camels, cats, and bats. In some cases, the viruses that cause illness in animals  can spread to humans. Where did the coronavirus come from? In December 2019, [place] told the [employer] [facility]) of several cases of lung disease (human respiratory illness). These cases were linked to an open seafood and livestock market in the city of [place]. The link to the seafood and livestock market suggests that the virus may have spread from animals to humans. However, since that first outbreak in December, the virus has also been shown to spread from person to person. What is the name of the disease and the virus? Disease name Early on, this disease was called novel coronavirus. This is because scientists determined that the disease was caused by a new (novel) respiratory virus. The World Health Organization [facility]) has now named the disease COVID-19, or coronavirus disease. Virus name The virus that  causes the disease is called severe acute respiratory syndrome coronavirus 2 (SARS-CoV-2). More information on disease and virus naming World Health Organization [facility]): www.who.int/emergencies/diseases/novel-coronavirus-2019/technical-guidance/naming-the-coronavirus-disease-(covid-2019)-and-the-virus-that-causes-it Who is at risk for complications from coronavirus disease? Some people may be at higher risk for complications from coronavirus disease. This includes older adults and people who have chronic diseases, such as heart disease, diabetes, and lung disease. If you are at higher risk for complications, take these extra precautions:  Avoid close contact with people who are sick or have a fever or cough. Stay at least 3-6 ft (1-2 m) away from them, if possible.  Wash your hands often with soap and water for at least 20 seconds.  Avoid touching your face, mouth, nose, or eyes.  Keep supplies on hand at home, such as food, medicine, and cleaning supplies.  Stay home as much as possible.  Avoid social gatherings and travel. How does coronavirus disease spread? The virus that causes coronavirus disease spreads easily from person to person (is contagious). There are also cases of community-spread disease. This means the disease has spread to:  People who have no known contact with other infected people.  People who have not traveled to areas where there are known cases. It appears to spread from one person to another through droplets from coughing or sneezing. Can I get the virus from touching surfaces or objects? There is still a lot that we do not know about the virus that causes coronavirus disease. Scientists are basing a lot of information on what they know about similar viruses, such as:  Viruses cannot generally survive on surfaces for long. They need a human body (host) to survive.  It is more likely that the virus is spread by close contact with people who are sick (direct  contact), such as through: ? Shaking hands or hugging. ? Breathing in respiratory droplets that travel through the air. This can happen when an infected person coughs or sneezes on or near other people.  It is less likely that the virus is spread when a person touches a surface or object that has the virus on it (indirect contact). The virus may be able to enter the body if the person touches a surface or object and then touches his or her face, eyes, nose, or mouth. Can a person spread the virus without having symptoms of the disease? It may be possible for the virus to spread before a person has symptoms of the disease, but this is most likely not the main way the virus is spreading. It is more likely for the virus to spread by being in close contact with people who are sick and breathing in the respiratory droplets of a  sick person's cough or sneeze. What are the symptoms of coronavirus disease? Symptoms vary from person to person and can range from mild to severe. Symptoms may include:  Fever.  Cough.  Tiredness, weakness, or fatigue.  Fast breathing or feeling short of breath. These symptoms can appear anywhere from 2 to 14 days after you have been exposed to the virus. If you develop symptoms, call your health care provider. People with severe symptoms may need hospital care. If I am exposed to the virus, how long does it take before symptoms start? Symptoms of coronavirus disease may appear anywhere from 2 to 14 days after a person has been exposed to the virus. If you develop symptoms, call your health care provider. Should I be tested for this virus? Your health care provider will decide whether to test you based on your symptoms, history of exposure, and your risk factors. How does a health care provider test for this virus? Health care providers will collect samples to send for testing. Samples may include:  Taking a swab of fluid from the nose.  Taking fluid from the lungs by  having you cough up mucus (sputum) into a sterile cup.  Taking a blood sample.  Taking a stool or urine sample. Is there a treatment or vaccine for this virus? Currently, there is no vaccine to prevent coronavirus disease. Also, there are no medicines like antibiotics or antivirals to treat the virus. A person who becomes sick is given supportive care, which means rest and fluids. A person may also relieve his or her symptoms by using over-the-counter medicines that treat sneezing, coughing, and runny nose. These are the same medicines that a person takes for the common cold. If you develop symptoms, call your health care provider. People with severe symptoms may need hospital care. What can I do to protect myself and my family from this virus?     You can protect yourself and your family by taking the same actions that you would take to prevent the spread of other viruses. Take the following actions:  Wash your hands often with soap and water for at least 20 seconds. If soap and water are not available, use alcohol-based hand sanitizer.  Avoid touching your face, mouth, nose, or eyes.  Cough or sneeze into a tissue, sleeve, or elbow. Do not cough or sneeze into your hand or the air. ? If you cough or sneeze into a tissue, throw it away immediately and wash your hands.  Disinfect objects and surfaces that you frequently touch every day.  Avoid close contact with people who are sick or have a fever or cough. Stay at least 3-6 ft (1-2 m) away from them, if possible.  Stay home if you are sick, except to get medical care. Call your health care provider before you get medical care.  Make sure your vaccines are up to date. Ask your health care provider what vaccines you need. What should I do if I need to travel? Follow travel recommendations from your local health authority, the CDC, and WHO. Travel information and advice  Centers for Disease Control and Prevention (CDC):  GeminiCard.gl  World Health Organization [facility]): PreviewDomains.se Know the risks and take action to protect your health  You are at higher risk of getting coronavirus disease if you are traveling to areas with an outbreak or if you are exposed to travelers from areas with an outbreak.  Wash your hands often and practice good hygiene to lower the risk of  catching or spreading the virus. What should I do if I am sick? General instructions to stop the spread of infection  Wash your hands often with soap and water for at least 20 seconds. If soap and water are not available, use alcohol-based hand sanitizer.  Cough or sneeze into a tissue, sleeve, or elbow. Do not cough or sneeze into your hand or the air.  If you cough or sneeze into a tissue, throw it away immediately and wash your hands.  Stay home unless you must get medical care. Call your health care provider or local health authority before you get medical care.  Avoid public areas. Do not take public transportation, if possible.  If you can, wear a mask if you must go out of the house or if you are in close contact with someone who is not sick. Keep your home clean  Disinfect objects and surfaces that are frequently touched every day. This may include: ? Counters and tables. ? Doorknobs and light switches. ? Sinks and faucets. ? Electronics such as phones, remote controls, keyboards, computers, and tablets.  Wash dishes in hot, soapy water or use a dishwasher. Air-dry your dishes.  Wash laundry in hot water. Prevent infecting other household members  Let healthy household members care for children and pets, if possible. If you have to care for children or pets, wash your hands often and wear a mask.  Sleep in a different bedroom or bed, if possible.  Do not share personal items, such as razors, toothbrushes, deodorant, combs,  brushes, towels, and washcloths. Where to find more information Centers for Disease Control and Prevention (CDC)  Information and news updates: CardRetirement.cz World Health Organization [facility])  Information and news updates: AffordableSalon.es  Coronavirus health topic: https://thompson-craig.com/  Questions and answers on COVID-19: kruiseway.com  Global tracker: who.sprinklr.com American Academy of Pediatrics (AAP)  Information for families: www.healthychildren.org/English/health-issues/conditions/chest-lungs/Pages/2019-Novel-Coronavirus.aspx The coronavirus situation is changing rapidly. Check your local health authority website or the CDC and [facility] websites for updates and news. When should I contact a health care provider?  Contact your health care provider if you have symptoms of an infection, such as fever or cough, and you: ? Have been near anyone who is known to have coronavirus disease. ? Have come into contact with a person who is suspected to have coronavirus disease. ? Have traveled outside of the country. When should I get emergency medical care?  Get help right away by calling your local emergency services (911 in the [place] if you have: ? Trouble breathing. ? Pain or pressure in your chest. ? Confusion. ? Blue-tinged lips and fingernails. ? Difficulty waking from sleep. ? Symptoms that get worse. Let the emergency medical personnel know if you think you have coronavirus disease. Summary  A new respiratory virus is spreading from person to person and causing COVID-19 (coronavirus disease).  The virus that causes COVID-19 appears to spread easily. It spreads from one person to another through droplets from coughing or sneezing.  Older adults and those with chronic diseases are at higher risk of disease. If you are at higher risk for complications, take extra  precautions.  There is currently no vaccine to prevent coronavirus disease. There are no medicines, such as antibiotics or antivirals, to treat the virus.  You can protect yourself and your family by washing your hands often, avoiding touching your face, and covering your coughs and sneezes. This information is not intended to replace advice given to you by your health care provider.  Make sure you discuss any questions you have with your health care provider. Document Released: 02/25/2019 Document Revised: 02/25/2019 Document Reviewed: 02/25/2019 Elsevier Patient Education  2020 Elsevier Inc. COVID-19: How to Protect Yourself and Others Know how it spreads  There is currently no vaccine to prevent coronavirus disease 2019 (COVID-19).  The best way to prevent illness is to avoid being exposed to this virus.  The virus is thought to spread mainly from person-to-person. ? Between people who are in close contact with one another (within about 6 feet). ? Through respiratory droplets produced when an infected person coughs, sneezes or talks. ? These droplets can land in the mouths or noses of people who are nearby or possibly be inhaled into the lungs. ? Some recent studies have suggested that COVID-19 may be spread by people who are not showing symptoms. Everyone should Clean your hands often  Wash your hands often with soap and water for at least 20 seconds especially after you have been in a public place, or after blowing your nose, coughing, or sneezing.  If soap and water are not readily available, use a hand sanitizer that contains at least 60% alcohol. Cover all surfaces of your hands and rub them together until they feel dry.  Avoid touching your eyes, nose, and mouth with unwashed hands. Avoid close contact  Stay home if you are sick.  Avoid close contact with people who are sick.  Put distance between yourself and other people. ? Remember that some people without symptoms may  be able to spread virus. ? This is especially important for people who are at higher risk of getting very RetroStamps.it Cover your mouth and nose with a cloth face cover when around others  You could spread COVID-19 to others even if you do not feel sick.  Everyone should wear a cloth face cover when they have to go out in public, for example to the grocery store or to pick up other necessities. ? Cloth face coverings should not be placed on young children under age 53, anyone who has trouble breathing, or is unconscious, incapacitated or otherwise unable to remove the mask without assistance.  The cloth face cover is meant to protect other people in case you are infected.  Do NOT use a facemask meant for a Research scientist (physical sciences).  Continue to keep about 6 feet between yourself and others. The cloth face cover is not a substitute for social distancing. Cover coughs and sneezes  If you are in a private setting and do not have on your cloth face covering, remember to always cover your mouth and nose with a tissue when you cough or sneeze or use the inside of your elbow.  Throw used tissues in the trash.  Immediately wash your hands with soap and water for at least 20 seconds. If soap and water are not readily available, clean your hands with a hand sanitizer that contains at least 60% alcohol. Clean and disinfect  Clean AND disinfect frequently touched surfaces daily. This includes tables, doorknobs, light switches, countertops, handles, desks, phones, keyboards, toilets, faucets, and sinks. ktimeonline.com  If surfaces are dirty, clean them: Use detergent or soap and water prior to disinfection.  Then, use a household disinfectant. You can see a list of EPA-registered household disinfectants here. SouthAmericaFlowers.co.uk 03/18/2019 This information is not  intended to replace advice given to you by your health care provider. Make sure you discuss any questions you have with your health care provider. Document Released: 02/25/2019 Document  Revised: 03/26/2019 Document Reviewed: 02/25/2019 Elsevier Patient Education  [employer].

## 2019-07-10 NOTE — ED Triage Notes (Signed)
Pt in via POV, reports waking up from nap with severe right elbow pain, decreased ROM noted due to pain.  Denies any recent injury.  NAD noted at this time.

## 2019-07-14 ENCOUNTER — Other Ambulatory Visit: Payer: Self-pay

## 2019-07-14 DIAGNOSIS — Z20822 Contact with and (suspected) exposure to covid-19: Secondary | ICD-10-CM

## 2019-07-16 LAB — NOVEL CORONAVIRUS, NAA: SARS-CoV-2, NAA: NOT DETECTED

## 2019-09-05 ENCOUNTER — Other Ambulatory Visit: Payer: Self-pay

## 2019-09-05 ENCOUNTER — Ambulatory Visit: Payer: Managed Care, Other (non HMO) | Admitting: Adult Health

## 2019-09-05 ENCOUNTER — Encounter: Payer: Self-pay | Admitting: Adult Health

## 2019-09-05 VITALS — BP 134/90 | HR 84 | Temp 97.2°F | Resp 20 | Ht 75.0 in | Wt 370.0 lb

## 2019-09-05 DIAGNOSIS — Z008 Encounter for other general examination: Secondary | ICD-10-CM

## 2019-09-05 DIAGNOSIS — Z6841 Body Mass Index (BMI) 40.0 and over, adult: Secondary | ICD-10-CM

## 2019-09-05 NOTE — Progress Notes (Signed)
Perry Macdonald DOB: 33 y.o. MRN: [id number]  Subjective: Allergies  Allergen Reactions  . Lisinopril Swelling  . Bee Venom Swelling  . Sulfa Antibiotics Hives and Itching    Here for Biometric Screen/brief exam Patient is a 33 year old male in no acute distress who comes to the clinic for his biometric screening and brief exam for his employer's insurance policy.  He is employed with [employer].  See medication and problem list reviewed below.  Social history also reviewed. He has no concerns for today's visit.  He sees primary care and endocrinology regularly.     Social History   Tobacco Use  . Smoking status: Never Smoker  . Smokeless tobacco: Never Used  Substance Use Topics  . Alcohol use: Yes  . Drug use: No   Patient Active Problem List   Diagnosis Date Noted  . Dry skin dermatitis 01/11/2018  . Morbid (severe) obesity due to excess calories ([place]) 12/27/2015  . 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 Type 2 diabetes mellitus ([place]) 04/17/2012  . Diabetes mellitus type 2 in obese ([place]) 07/21/2011  . BP (high blood pressure) 07/21/2011    Current Outpatient Medications:  .  Blood Glucose Monitoring Suppl (FIFTY50 GLUCOSE METER 2.0) w/Device KIT, Use as directed, Disp: , Rfl:  .  amLODipine (NORVASC) 10 MG tablet, Take 10 mg by mouth daily., Disp: , Rfl:  .  B-D UF III MINI PEN NEEDLES 31G X 5 MM MISC, , Disp: , Rfl:  .  busPIRone (BUSPAR) 7.5 MG tablet, Take by mouth., Disp: , Rfl:  .  famotidine (PEPCID) 20 MG tablet, , Disp: , Rfl:  .  hydrochlorothiazide (HYDRODIURIL) 25 MG tablet, , Disp: , Rfl:  .  HYDROcodone-acetaminophen (NORCO/VICODIN) 5-325 MG tablet, Take 1 tablet by mouth every 4 (four) hours as needed for moderate pain., Disp: 20 tablet, Rfl: 0 .  Insulin Isophane & Regular Human (HUMULIN 70/30 KWIKPEN) (70-30) 100 UNIT/ML PEN, Inject 60 units with breakfast and 60 units with  dinner, Disp: , Rfl:  .  Insulin Pen Needle (FIFTY50 PEN NEEDLES) 31G X 5 MM MISC, as directed, Disp: , Rfl:  .  meloxicam (MOBIC) 15 MG tablet, Take 1 tablet (15 mg total) by mouth daily., Disp: 30 tablet, Rfl: 3 .  metFORMIN (GLUCOPHAGE) 1000 MG tablet, , Disp: , Rfl:  .  olmesartan (BENICAR) 40 MG tablet, , Disp: , Rfl:  .  ondansetron (ZOFRAN ODT) 4 MG disintegrating tablet, Take 1 tablet (4 mg total) by mouth every 8 (eight) hours as needed., Disp: 20 tablet, Rfl: 0 .  pravastatin (PRAVACHOL) 10 MG tablet, Take by mouth., Disp: , Rfl:  .  VICTOZA 18 MG/3ML SOPN, , Disp: , Rfl:   Objective: Today's Vitals   09/05/19 0845  BP: 134/90  Pulse: 84  Resp: 20  Temp: (!) 97.2 F (36.2 C)  TempSrc: Temporal  SpO2: 98%  Weight: (!) 370 lb (167.8 kg)  Height: 6' 3"  (1.905 m)   Body mass index is 46.25 kg/m.   He did not take any of his medications this morning.   NAD, well-developed well-nourished. Patient is alert and oriented and responsive to questions Engages in eye contact with provider. Speaks in full sentences without any pauses without any shortness of breath or distress.  HEENT: Within normal limits Neck: Normal, supple, thyroid normal, no cervical lymphadenopathy Heart: Regular rate and rhythm without murmurs rubs or gallops Lungs: Clear to auscultation without any adventitious  lung sounds Neuro:Patient moves on and off of exam table and in room without difficulty. Gait is normal in hall and in room.   Assessment: Biometric screen Encounter for biometric screening  Encounter for other general examination- not a full annual physical-biometric screening with brief exam only.    Plan:  I will have the office call you on your glucose and cholesterol results when they return if you have not heard within 1 week please call the office or sent to your Mychart account. Please call the clinic during office hours with any questions or concerns.  This biometric physical is a  brief physical and the only labs done are glucose and your lipid panel(cholesterol) and is  not a substitute for seeing a primary care provider for a complete annual physical. Please see a primary care physician for routine health maintenance, labs and full physical at least yearly and follow up as recommended by your provider. Provider also recommends if you do not have a primary care provider for patient to establish care as soon as possible .Patient may chose provider of choice. Also gave the [facility] at [phone] or web site at [email or web address] to help assist with finding a primary care doctor.  Patient verbalizes understanding that his office is acute care only and not a substitute for a primary care or for the management of chronic conditions.    Take your blood pressure medications and all medications you skipped this morning, keep tract of blood pressure and glucose readings for your doctors. Fasting glucose and lipids. Discussed with patient that today's visit here is a limited biometric screening visit (not a comprehensive exam or management of any chronic problems) Discussed some health issues, including healthy eating habits and exercise. Encouraged to follow-up with PCP for annual comprehensive preventive and wellness care (and if applicable, any chronic issues). Questions invited and answered.

## 2019-09-05 NOTE — Patient Instructions (Signed)
   I will have the office call you on your glucose and cholesterol results when they return if you have not heard within 1 week please call the office or sent to your Mychart account. Please call the clinic during office hours with any questions or concerns.  This biometric physical is a brief physical and the only labs done are glucose and your lipid panel(cholesterol) and is  not a substitute for seeing a primary care provider for a complete annual physical. Please see a primary care physician for routine health maintenance, labs and full physical at least yearly and follow up as recommended by your provider. Provider also recommends if you do not have a primary care provider for patient to establish care as soon as possible .Patient may chose provider of choice. Also gave the [facility]  PHYSICIAN/PROVIDER  REFERRAL LINE at [phone]- [phone] or web site at [email or web address] to help assist with finding a primary care doctor.  Patient verbalizes understanding that his office is acute care only and not a substitute for a primary care or for the management of chronic conditions.    Health Maintenance, Male Adopting a healthy lifestyle and getting preventive care are important in promoting health and wellness. Ask your health care provider about:  The right schedule for you to have regular tests and exams.  Things you can do on your own to prevent diseases and keep yourself healthy. What should I know about diet, weight, and exercise? Eat a healthy diet   Eat a diet that includes plenty of vegetables, fruits, low-fat dairy products, and lean protein.  Do not eat a lot of foods that are high in solid fats, added sugars, or sodium. Maintain a healthy weight Body mass index (BMI) is a measurement that can be used to identify possible weight problems. It estimates body fat based on height and weight. Your health care provider can help determine your BMI and help you achieve or maintain a healthy  weight. Get regular exercise Get regular exercise. This is one of the most important things you can do for your health. Most adults should:  Exercise for at least 150 minutes each week. The exercise should increase your heart rate and make you sweat (moderate-intensity exercise).  Do strengthening exercises at least twice a week. This is in addition to the moderate-intensity exercise.  Spend less time sitting. Even light physical activity can be beneficial. Watch cholesterol and blood lipids Have your blood tested for lipids and cholesterol at 31 years of age, then have this test every 5 years. You may need to have your cholesterol levels checked more often if:  Your lipid or cholesterol levels are high.  You are older than 31 years of age.  You are at high risk for heart disease. What should I know about cancer screening? Many types of cancers can be detected early and may often be prevented. Depending on your health history and family history, you may need to have cancer screening at various ages. This may include screening for:  Colorectal cancer.  Prostate cancer.  Skin cancer.  Lung cancer. What should I know about heart disease, diabetes, and high blood pressure? Blood pressure and heart disease  High blood pressure causes heart disease and increases the risk of stroke. This is more likely to develop in people who have high blood pressure readings, are of African descent, or are overweight.  Talk with your health care provider about your target blood pressure readings.  Have your   blood pressure checked: ? Every 3-5 years if you are 18-39 years of age. ? Every year if you are 40 years old or older.  If you are between the ages of 65 and 75 and are a current or former smoker, ask your health care provider if you should have a one-time screening for abdominal aortic aneurysm (AAA). Diabetes Have regular diabetes screenings. This checks your fasting blood sugar level. Have  the screening done:  Once every three years after age 45 if you are at a normal weight and have a low risk for diabetes.  More often and at a younger age if you are overweight or have a high risk for diabetes. What should I know about preventing infection? Hepatitis B If you have a higher risk for hepatitis B, you should be screened for this virus. Talk with your health care provider to find out if you are at risk for hepatitis B infection. Hepatitis C Blood testing is recommended for:  Everyone born from 1945 through 1965.  Anyone with known risk factors for hepatitis C. Sexually transmitted infections (STIs)  You should be screened each year for STIs, including gonorrhea and chlamydia, if: ? You are sexually active and are younger than 31 years of age. ? You are older than 31 years of age and your health care provider tells you that you are at risk for this type of infection. ? Your sexual activity has changed since you were last screened, and you are at increased risk for chlamydia or gonorrhea. Ask your health care provider if you are at risk.  Ask your health care provider about whether you are at high risk for HIV. Your health care provider may recommend a prescription medicine to help prevent HIV infection. If you choose to take medicine to prevent HIV, you should first get tested for HIV. You should then be tested every 3 months for as long as you are taking the medicine. Follow these instructions at home: Lifestyle  Do not use any products that contain nicotine or tobacco, such as cigarettes, e-cigarettes, and chewing tobacco. If you need help quitting, ask your health care provider.  Do not use street drugs.  Do not share needles.  Ask your health care provider for help if you need support or information about quitting drugs. Alcohol use  Do not drink alcohol if your health care provider tells you not to drink.  If you drink alcohol: ? Limit how much you have to 0-2  drinks a day. ? Be aware of how much alcohol is in your drink. In the [place] one drink equals one 12 oz bottle of beer (355 mL), one 5 oz glass of wine (148 mL), or one 1 oz glass of hard liquor (44 mL). General instructions  Schedule regular health, dental, and eye exams.  Stay current with your vaccines.  Tell your health care provider if: ? You often feel depressed. ? You have ever been abused or do not feel safe at home. Summary  Adopting a healthy lifestyle and getting preventive care are important in promoting health and wellness.  Follow your health care provider's instructions about healthy diet, exercising, and getting tested or screened for diseases.  Follow your health care provider's instructions on monitoring your cholesterol and blood pressure. This information is not intended to replace advice given to you by your health care provider. Make sure you discuss any questions you have with your health care provider. Document Released: 04/27/2008 Document Revised: 10/23/2018 Document Reviewed:   10/23/2018 Elsevier Patient Education  2020 Elsevier Inc.  

## 2019-09-06 LAB — LIPID PANEL
Chol/HDL Ratio: 4.4 ratio (ref 0.0–5.0)
Cholesterol, Total: 151 mg/dL (ref 100–199)
HDL: 34 mg/dL — ABNORMAL LOW (ref >39)
LDL Chol Calc (NIH): 92 mg/dL (ref 0–99)
Triglycerides: 137 mg/dL (ref 0–149)
VLDL Cholesterol Cal: 25 mg/dL (ref 5–40)

## 2019-09-06 LAB — GLUCOSE, RANDOM: Glucose: 109 mg/dL — ABNORMAL HIGH (ref 65–99)

## 2019-09-24 ENCOUNTER — Encounter: Payer: Self-pay | Admitting: Adult Health

## 2020-06-08 ENCOUNTER — Other Ambulatory Visit: Payer: Self-pay | Admitting: Nephrology

## 2020-06-08 DIAGNOSIS — E1121 Type 2 diabetes mellitus with diabetic nephropathy: Secondary | ICD-10-CM

## 2020-06-08 DIAGNOSIS — R809 Proteinuria, unspecified: Secondary | ICD-10-CM

## 2020-06-17 ENCOUNTER — Ambulatory Visit: Payer: Managed Care, Other (non HMO)

## 2020-07-16 ENCOUNTER — Ambulatory Visit: Payer: Managed Care, Other (non HMO)

## 2020-07-27 ENCOUNTER — Other Ambulatory Visit: Payer: Self-pay

## 2020-07-27 ENCOUNTER — Ambulatory Visit: Payer: Managed Care, Other (non HMO) | Admitting: Podiatry

## 2020-07-27 ENCOUNTER — Ambulatory Visit (INDEPENDENT_AMBULATORY_CARE_PROVIDER_SITE_OTHER): Payer: Managed Care, Other (non HMO)

## 2020-07-27 DIAGNOSIS — M2141 Flat foot [pes planus] (acquired), right foot: Secondary | ICD-10-CM

## 2020-07-27 DIAGNOSIS — M7751 Other enthesopathy of right foot: Secondary | ICD-10-CM

## 2020-07-27 DIAGNOSIS — M2142 Flat foot [pes planus] (acquired), left foot: Secondary | ICD-10-CM | POA: Diagnosis not present

## 2020-07-27 NOTE — Progress Notes (Signed)
   HPI: 34 y.o. male presenting today with a new complaint regarding right great toe pain.  Patient states that approximately 1 week ago he experienced some right great toe pain and was very sensitive and painful to walk on.  He does state that he has a history of eating significant amounts of seafood and red meats.  Fortunately over the last day or 2 the patient has had no pain to the right great toe.  He has been ambulating just fine without pain.  He presents for further treatment evaluation  Past Medical History:  Diagnosis Date  . Diabetes mellitus without complication (HCC)   . Hypertension      Physical Exam: General: The patient is alert and oriented x3 in no acute distress.  Dermatology: Skin is warm, dry and supple bilateral lower extremities. Negative for open lesions or macerations.  Vascular: Palpable pedal pulses bilaterally. No edema or erythema noted. Capillary refill within normal limits.  Neurological: Epicritic and protective threshold grossly intact bilaterally.   Musculoskeletal Exam: Range of motion within normal limits to all pedal and ankle joints bilateral. Muscle strength 5/5 in all groups bilateral.   Radiographic Exam:  Normal osseous mineralization. Joint spaces preserved. No fracture/dislocation/boney destruction.    Assessment: 1.  First MTPJ capsulitis/possible acute gout right   Plan of Care:  1. Patient evaluated. X-Rays reviewed.  2.  Explained to the patient the etiology of gout as it relates to diet.  Recommend limiting the amount of red meats and seafood 3.  Continue wearing good supportive shoes 4.  Return to clinic as needed  *Works for [employer] office at the [place].      Felecia Shelling, DPM [facility]  Dr. Felecia Shelling, DPM    [address] [place], [place] [address]                Office [phone]  Fax [phone]

## 2020-07-29 ENCOUNTER — Other Ambulatory Visit: Payer: Self-pay | Admitting: Podiatry

## 2020-07-29 DIAGNOSIS — M7751 Other enthesopathy of right foot: Secondary | ICD-10-CM

## 2020-08-03 ENCOUNTER — Ambulatory Visit: Payer: Managed Care, Other (non HMO)

## 2020-09-03 ENCOUNTER — Ambulatory Visit: Payer: Managed Care, Other (non HMO) | Admitting: Physician Assistant

## 2020-09-03 ENCOUNTER — Encounter: Payer: Self-pay | Admitting: Physician Assistant

## 2020-09-03 VITALS — BP 165/104 | HR 91 | Ht 74.0 in | Wt 365.0 lb

## 2020-09-03 DIAGNOSIS — Z008 Encounter for other general examination: Secondary | ICD-10-CM | POA: Diagnosis not present

## 2020-09-03 NOTE — Progress Notes (Signed)
   Subjective:    Patient ID: Perry Macdonald, male    DOB: August 27, 1986, 34 y.o.   MRN: [id number]  HPI Pt is employed as a Corporate treasurer.  Pt is here for a biometric physical.  Pt has a past medical history of Insulin dependent diabetes  Pt does not smoke or drink.  Pt reports he has not been able to get the exercise he would like to.  Pt is a night shift worker  Pt did not take his blood pressure medications this am.   Past Medical History:  Diagnosis Date  . Diabetes mellitus without complication ([place])   . Hypertension    Review of Systems  Constitutional: Negative for activity change and appetite change.  HENT: Negative for dental problem.   Eyes: Negative for pain.  Respiratory: Negative for cough.   Musculoskeletal: Negative for back pain and myalgias.  Skin: Negative for color change.  All other systems reviewed and are negative.   Current Outpatient Medications:  .  amLODipine (NORVASC) 10 MG tablet, Take 10 mg by mouth daily., Disp: , Rfl:  .  B-D UF III MINI PEN NEEDLES 31G X 5 MM MISC, , Disp: , Rfl:  .  Blood Glucose Monitoring Suppl (FIFTY50 GLUCOSE METER 2.0) w/Device KIT, Use as directed, Disp: , Rfl:  .  hydrochlorothiazide (HYDRODIURIL) 25 MG tablet, , Disp: , Rfl:  .  Insulin Isophane & Regular Human (HUMULIN 70/30 KWIKPEN) (70-30) 100 UNIT/ML PEN, Inject 60 units with breakfast and 60 units with dinner, Disp: , Rfl:  .  Insulin Pen Needle (FIFTY50 PEN NEEDLES) 31G X 5 MM MISC, as directed, Disp: , Rfl:  .  metFORMIN (GLUCOPHAGE) 1000 MG tablet, , Disp: , Rfl:  .  olmesartan (BENICAR) 40 MG tablet, , Disp: , Rfl:  .  VICTOZA 18 MG/3ML SOPN, , Disp: , Rfl:     Objective:   Physical Exam Vitals and nursing note reviewed.  Constitutional:      Appearance: He is well-developed. He is obese.  HENT:     Head: Normocephalic.     Right Ear: Tympanic membrane normal.     Left Ear: Tympanic membrane normal.     Nose: Nose normal.     Mouth/Throat:     Mouth:  Mucous membranes are moist.  Eyes:     Extraocular Movements: Extraocular movements intact.     Conjunctiva/sclera: Conjunctivae normal.     Pupils: Pupils are equal, round, and reactive to light.  Cardiovascular:     Rate and Rhythm: Normal rate and regular rhythm.  Pulmonary:     Effort: Pulmonary effort is normal.  Abdominal:     General: Abdomen is flat. There is no distension.     Palpations: There is no mass.     Hernia: No hernia is present.  Musculoskeletal:        General: Normal range of motion.     Cervical back: Normal range of motion.  Neurological:     General: No focal deficit present.     Mental Status: He is alert and oriented to person, place, and time.  Psychiatric:        Mood and Affect: Mood normal.           Assessment & Plan: Biometric Physical  Hypertension Pt advised to take his medications and have blood pressure rechecked   Diabetes

## 2020-09-04 LAB — LIPID PANEL
Chol/HDL Ratio: 5.3 ratio — ABNORMAL HIGH (ref 0.0–5.0)
Cholesterol, Total: 163 mg/dL (ref 100–199)
HDL: 31 mg/dL — ABNORMAL LOW (ref >39)
LDL Chol Calc (NIH): 83 mg/dL (ref 0–99)
Triglycerides: 297 mg/dL — ABNORMAL HIGH (ref 0–149)
VLDL Cholesterol Cal: 49 mg/dL — ABNORMAL HIGH (ref 5–40)

## 2020-09-04 LAB — GLUCOSE, RANDOM: Glucose: 132 mg/dL — ABNORMAL HIGH (ref 65–99)

## 2020-12-08 IMAGING — CT CT ABDOMEN AND PELVIS WITH CONTRAST
2 of 4 series · 16 of 46 positions shown, 18 images · IV contrast (APPLIED)
Comparison: Abdomen ultrasound 06/10/2018.

CLINICAL DATA: 32-year-old male with several days of abdominal
pain, infraumbilical pain.

EXAM:
CT ABDOMEN AND PELVIS WITH CONTRAST
TECHNIQUE: Multidetector CT imaging of the abdomen and pelvis was performed
using the standard protocol following bolus administration of
intravenous contrast.
CONTRAST:  150mL OMNIPAQUE IOHEXOL 300 MG/ML  SOLN

[Series 2: routine abd/pel with · axial · 0.98mm/px · z∈[-1179,-689]mm · 13 of 108 slices shown, 15 images]
[im 5/108  soft-tissue]
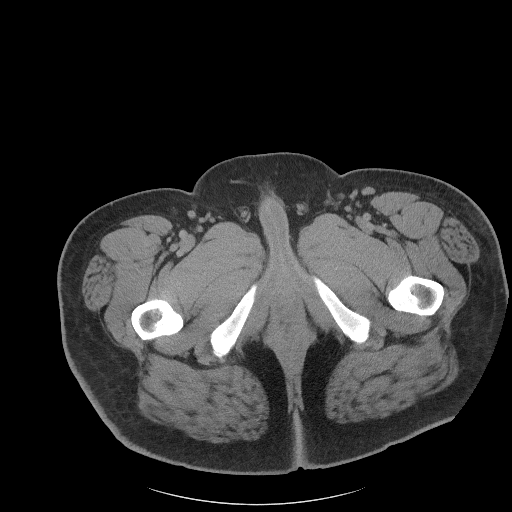
[im 5/108  bone]
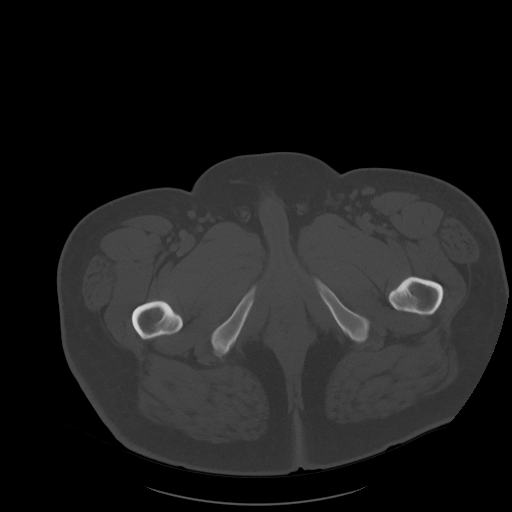
[im 14/108  soft-tissue]
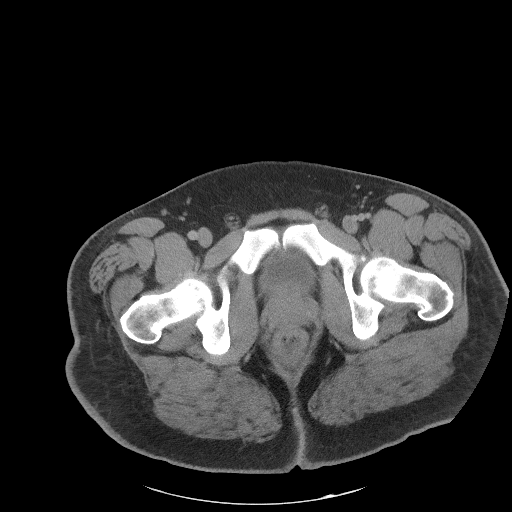
[im 23/108  soft-tissue]
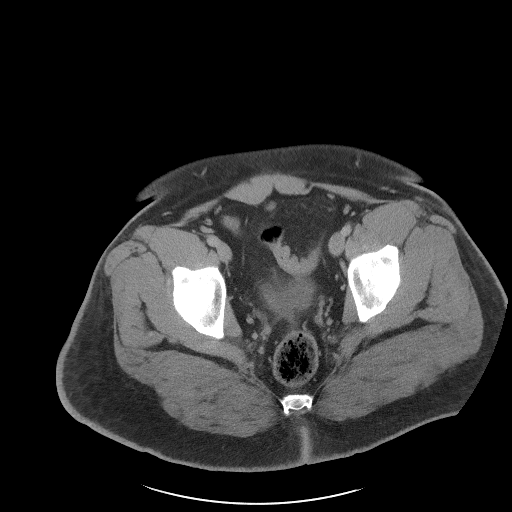
[im 32/108  soft-tissue]
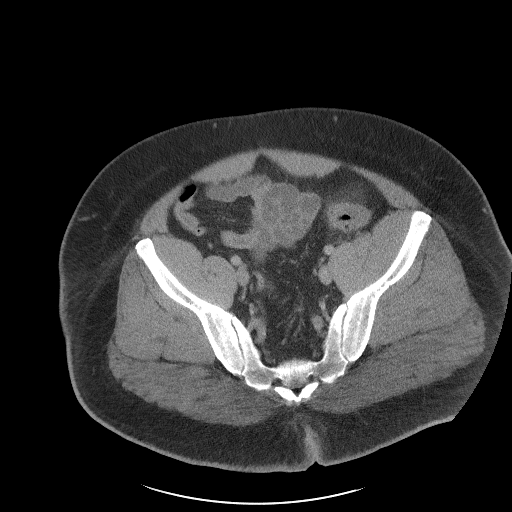
[im 36/108  soft-tissue]
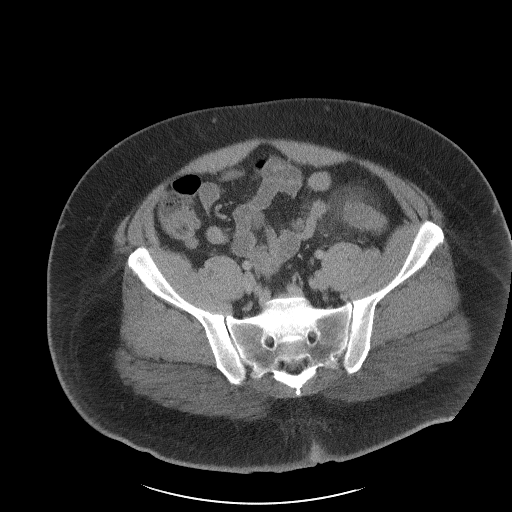
[im 45/108  soft-tissue]
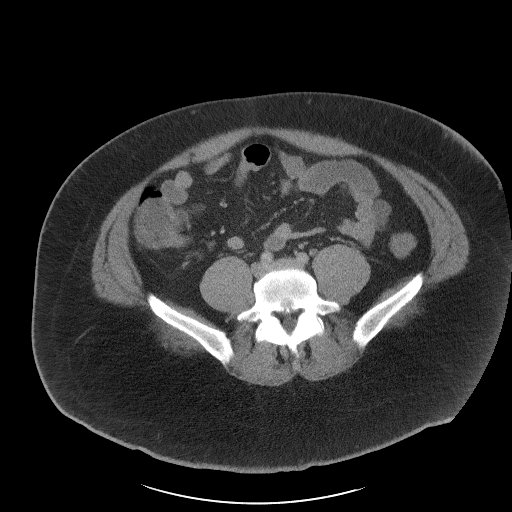
[im 54/108  soft-tissue]
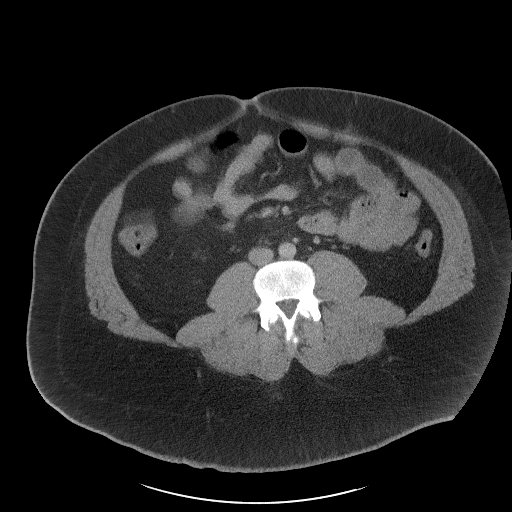
[im 63/108  soft-tissue]
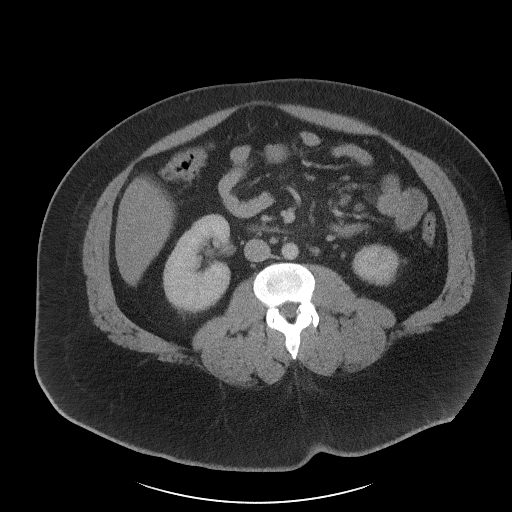
[im 72/108  soft-tissue]
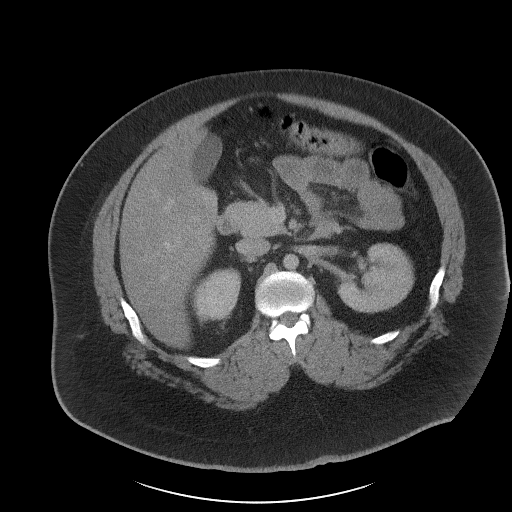
[im 72/108  bone]
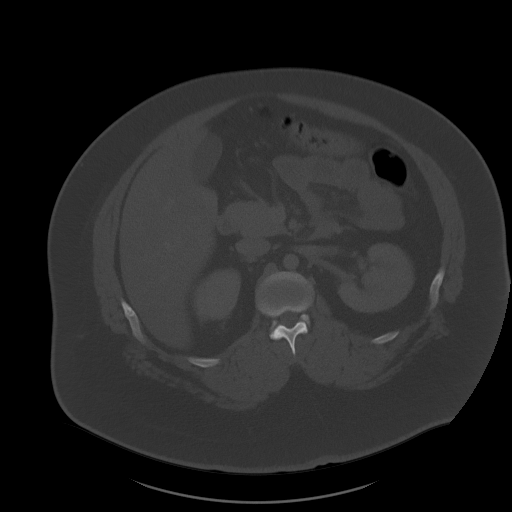
[im 76/108  soft-tissue]
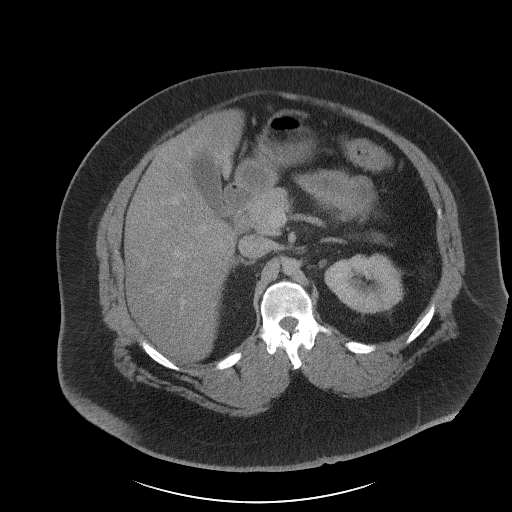
[im 85/108  soft-tissue]
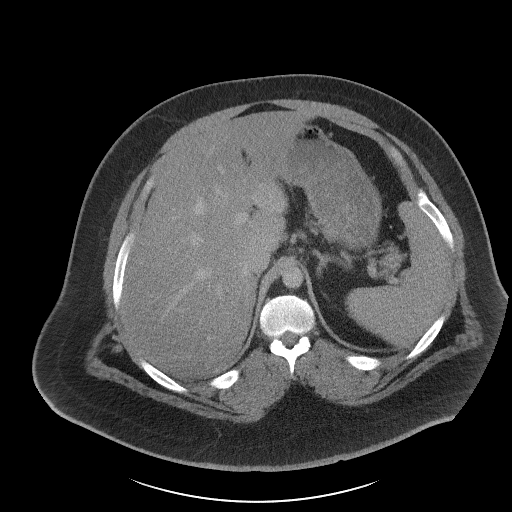
[im 94/108  soft-tissue]
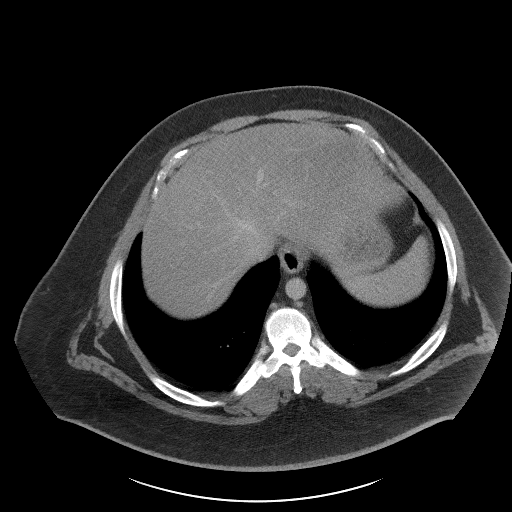
[im 103/108  soft-tissue]
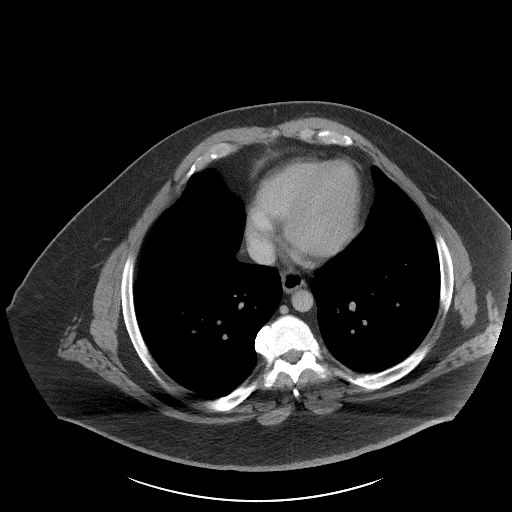

[Series 5: coronal st · coronal · 0.93mm/px · 3 of 126 slices shown]
[im 42/126  soft-tissue]
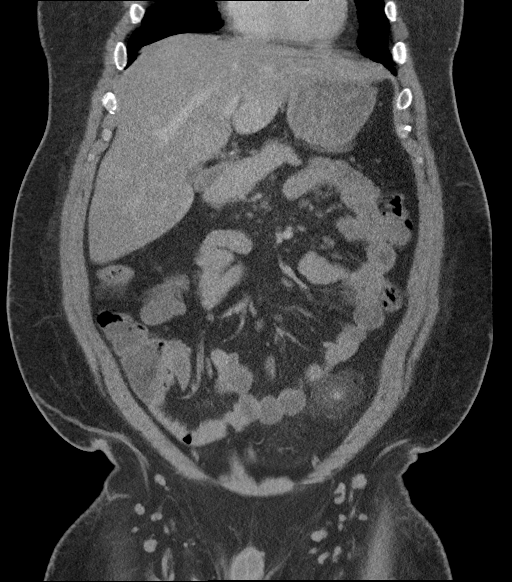
[im 56/126  soft-tissue]
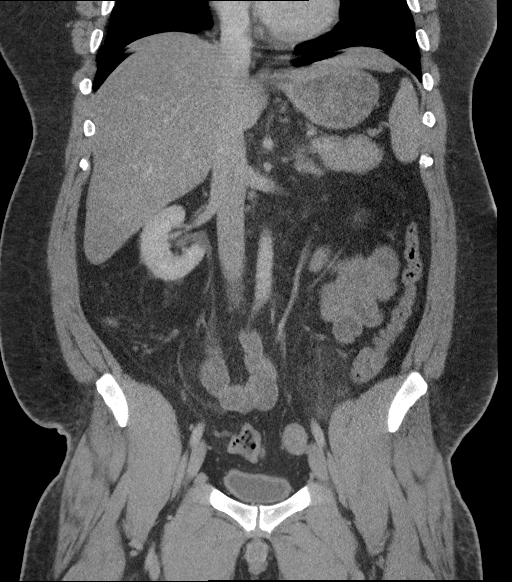
[im 70/126  soft-tissue]
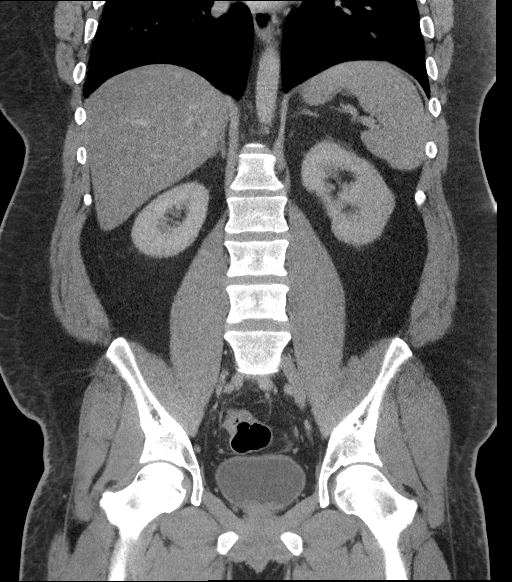

[16 of 46 positions shown; findings below may reference images not displayed]

FINDINGS: Lower chest: Negative.

Hepatobiliary: Evidence of hepatic steatosis. Areas of fatty sparing
suspected near the gallbladder fossa and in the left lobe. Negative
gallbladder.

Pancreas: Negative.

Spleen: Negative.

Adrenals/Urinary Tract: Normal adrenal glands.

Renal enhancement appears symmetric and within normal limits. No
hydronephrosis. Negative ureters. Unremarkable urinary bladder.

Stomach/Bowel: Negative rectum aside from retained stool.

At the junction of the descending and sigmoid colon there is a
centimeter area of mesenteric stranding surrounding an area of
diverticulosis, and associated with circumferential large bowel wall
thickening up to 12 millimeters. No extraluminal gas. No free fluid.

Upstream the descending colon appears normal. Negative transverse
colon, right colon, appendix, and terminal ileum. No dilated small
bowel. Negative stomach. No free air. No abdominal free fluid.

Vascular/Lymphatic: Suboptimal intravascular contrast bolus but the
major arterial structures appear patent. Portal venous system
appears to be patent.

Reproductive: Negative.

Other: No pelvic free fluid.

Musculoskeletal: No acute osseous abnormality identified.
IMPRESSION: 1. Positive for acute diverticulitis and/or colitis at the junction
of the descending and sigmoid colon. No abscess or complicating
features.
2. Hepatic steatosis.

## 2022-04-13 HISTORY — PX: MENISCUS REPAIR: SHX5179

## 2022-09-07 ENCOUNTER — Encounter
Admission: RE | Admit: 2022-09-07 | Discharge: 2022-09-07 | Disposition: A | Discharge status: Home / Self Care | Payer: Managed Care, Other (non HMO) | Source: Ambulatory Visit | Attending: Orthopaedic Surgery | Admitting: Orthopaedic Surgery

## 2022-09-07 ENCOUNTER — Other Ambulatory Visit: Payer: Self-pay

## 2022-09-07 VITALS — Ht 75.0 in | Wt 361.0 lb

## 2022-09-07 DIAGNOSIS — Z79899 Other long term (current) drug therapy: Secondary | ICD-10-CM

## 2022-09-07 DIAGNOSIS — Z01810 Encounter for preprocedural cardiovascular examination: Secondary | ICD-10-CM

## 2022-09-07 DIAGNOSIS — E119 Type 2 diabetes mellitus without complications: Secondary | ICD-10-CM

## 2022-09-07 DIAGNOSIS — Z01812 Encounter for preprocedural laboratory examination: Secondary | ICD-10-CM

## 2022-09-07 HISTORY — DX: Anxiety disorder, unspecified: F41.9

## 2022-09-07 HISTORY — DX: Gastro-esophageal reflux disease without esophagitis: K21.9

## 2022-09-07 NOTE — Patient Instructions (Addendum)
Your procedure is scheduled on:September 14, 2022 THURSDAY Report to the Registration Desk on the 1st floor of the [facility]. To find out your arrival time, please call [phone] between 1PM - 3PM on: September 13, 2022 [facility] If your arrival time is 6:00 am, do not arrive prior to that time as the [facility] entrance doors do not open until 6:00 am.  REMEMBER: Instructions that are not followed completely may result in serious medical risk, up to and including death; or upon the discretion of your surgeon and anesthesiologist your surgery may need to be rescheduled.  Do not eat food after midnight the night before surgery.  No gum chewing, lozengers or hard candies.  You may however, drink CLEAR liquids up to 2 hours before you are scheduled to arrive for your surgery. Do not drink anything within 2 hours of your scheduled arrival time.  Clear liquids include: - water   In addition, your doctor has ordered for you to drink the provided  Gatorade G2 Drinking this carbohydrate drink up to two hours before surgery helps to reduce insulin resistance and improve patient outcomes. Please complete drinking 2 hours prior to scheduled arrival time.  TAKE THESE MEDICATIONS THE MORNING OF SURGERY WITH A SIP OF WATER: AMLODIPINE BUSPIRONE METOPROLOL OMEPRAZOLE (take one the night before and one on the morning of surgery - helps to prevent nausea after surgery.)   OZEMPIC NEEDS TO BE HELD FOR ONE WEEK BEFORE SURGERY. NONE AFTER WEDNESDAY September 06, 2022.  NO METFORMIN FOR 2 DAYS BEFORE SURGERY LAST DOSE 09/11/2022 MONDAY  NO INSULIN THE MORNING OF SURGERY  One week prior to surgery: Stop Anti-inflammatories (NSAIDS) such as MELOXICAM, Advil, Aleve, Ibuprofen, Motrin, Naproxen, Naprosyn and Aspirin based products such as Excedrin, Goodys Powder, BC Powder. Stop ANY OVER THE COUNTER supplements until after surgery. You may however, continue to take Tylenol if needed for pain up until  the day of surgery.  No Alcohol for 24 hours before or after surgery.  No Smoking including e-cigarettes for 24 hours prior to surgery.  No chewable tobacco products for at least 6 hours prior to surgery.  No nicotine patches on the day of surgery.  Do not use any "recreational" drugs for at least a week prior to your surgery.  Please be advised that the combination of cocaine and anesthesia may have negative outcomes, up to and including death. If you test positive for cocaine, your surgery will be cancelled.  On the morning of surgery brush your teeth with toothpaste and water, you may rinse your mouth with mouthwash if you wish. Do not swallow any toothpaste or mouthwash.  Use CHG Soap as directed on instruction sheet.  Do not wear jewelry, make-up, hairpins, clips or nail polish.  Do not wear lotions, powders, perfumes , DEODORANT OR COLOGNE  Do not shave body from the neck down 48 hours prior to surgery just in case you cut yourself which could leave a site for infection.  Also, freshly shaved skin may become irritated if using the CHG soap.  Contact lenses, hearing aids and dentures may not be worn into surgery.  Do not bring valuables to the hospital. [facility] is not responsible for any missing/lost belongings or valuables.   Notify your doctor if there is any change in your medical condition (cold, fever, infection).  Wear comfortable clothing (specific to your surgery type) to the hospital.  After surgery, you can help prevent lung complications by doing breathing exercises.  Take  deep breaths and cough every 1-2 hours. Your doctor may order a device called an Incentive Spirometer to help you take deep breaths. When coughing or sneezing, hold a pillow firmly against your incision with both hands. This is called "splinting." Doing this helps protect your incision. It also decreases belly discomfort.  If you are being discharged the day of surgery, you will not be  allowed to drive home. You will need a responsible adult (18 years or older) to drive you home and stay with you that night.   If you are taking public transportation, you will need to have a responsible adult (18 years or older) with you. Please confirm with your physician that it is acceptable to use public transportation.   Please call the [facility] at [phone] if you have any questions about these instructions.  Surgery Visitation Policy:  Patients undergoing a surgery or procedure may have two family members or support persons with them as long as the person is not COVID-19 positive or experiencing its symptoms.

## 2022-09-11 ENCOUNTER — Encounter: Payer: Self-pay | Admitting: Urgent Care

## 2022-09-11 ENCOUNTER — Encounter
Admission: RE | Admit: 2022-09-11 | Discharge: 2022-09-11 | Disposition: A | Discharge status: Home / Self Care | Payer: Managed Care, Other (non HMO) | Source: Ambulatory Visit | Attending: Orthopaedic Surgery | Admitting: Orthopaedic Surgery

## 2022-09-11 DIAGNOSIS — E118 Type 2 diabetes mellitus with unspecified complications: Secondary | ICD-10-CM | POA: Diagnosis not present

## 2022-09-11 DIAGNOSIS — Z01818 Encounter for other preprocedural examination: Secondary | ICD-10-CM | POA: Diagnosis not present

## 2022-09-11 DIAGNOSIS — Z7984 Long term (current) use of oral hypoglycemic drugs: Secondary | ICD-10-CM | POA: Diagnosis not present

## 2022-09-11 DIAGNOSIS — Z01812 Encounter for preprocedural laboratory examination: Secondary | ICD-10-CM

## 2022-09-11 DIAGNOSIS — Z01810 Encounter for preprocedural cardiovascular examination: Secondary | ICD-10-CM

## 2022-09-11 DIAGNOSIS — Z79899 Other long term (current) drug therapy: Secondary | ICD-10-CM | POA: Diagnosis not present

## 2022-09-11 DIAGNOSIS — Z794 Long term (current) use of insulin: Secondary | ICD-10-CM | POA: Diagnosis not present

## 2022-09-11 LAB — BASIC METABOLIC PANEL
Anion gap: 11 (ref 5–15)
BUN: 12 mg/dL (ref 6–20)
CO2: 24 mmol/L (ref 22–32)
Calcium: 9.4 mg/dL (ref 8.9–10.3)
Chloride: 105 mmol/L (ref 98–111)
Creatinine, Ser: 0.81 mg/dL (ref 0.61–1.24)
GFR, Estimated: >60 mL/min (ref >60)
Glucose, Bld: 302 mg/dL — ABNORMAL HIGH (ref 70–99)
Potassium: 3.7 mmol/L (ref 3.5–5.1)
Sodium: 140 mmol/L (ref 135–145)

## 2022-09-11 NOTE — Progress Notes (Signed)
  Perioperative Services: Pre-Admission/Anesthesia Testing  Abnormal Lab Notification    Date: 09/11/22  Name: Perry Macdonald MRN:   [id number]  Re: Abnormal labs noted during PAT appointment   Provider(s) Notified: Renee Harder, MD Notification mode: Routed and/or faxed via [facility]   ABNORMAL LAB VALUE(S): Lab Results  Component Value Date   GLUCOSE 302 (H) 09/11/2022   Notes: Patient with a T2DM diagnosis. He is currently on both oral (metformin) and parenteral (Humulin 70/30 and Ozempic) therapies. Last Hgb A1c was 8.8% on 07/18/2022. In efforts to reduce his risk of developing SSI, or other potential perioperative complications, this communication is being sent in order to determine if patient is deemed to have adequate medical optimization, including preoperative glycemic control.   With that being said, the benefit of improving glycemic control must be weighed against the overall risk associated with delaying a necessary elective orthopedic surgery for this patient. This is a Community education officer; no formal response is required.  Honor Loh, MSN, APRN, FNP-C, CEN [facility]  Peri-operative Services Nurse Practitioner Phone: [phone] 09/11/22 2:11 PM

## 2022-09-14 ENCOUNTER — Ambulatory Visit: Payer: No Typology Code available for payment source

## 2022-09-14 ENCOUNTER — Encounter: Payer: Self-pay | Admitting: Orthopaedic Surgery

## 2022-09-14 ENCOUNTER — Ambulatory Visit: Payer: No Typology Code available for payment source | Admitting: Certified Registered Nurse Anesthetist

## 2022-09-14 ENCOUNTER — Other Ambulatory Visit: Payer: Self-pay

## 2022-09-14 ENCOUNTER — Encounter
Admission: RE | Disposition: A | Discharge status: Home / Self Care | Payer: Self-pay | Source: Home / Self Care | Attending: Orthopaedic Surgery

## 2022-09-14 ENCOUNTER — Ambulatory Visit
Admission: RE | Admit: 2022-09-14 | Discharge: 2022-09-14 | Disposition: A | Discharge status: Home / Self Care | Payer: No Typology Code available for payment source | Attending: Orthopaedic Surgery | Admitting: Orthopaedic Surgery

## 2022-09-14 DIAGNOSIS — E119 Type 2 diabetes mellitus without complications: Secondary | ICD-10-CM | POA: Diagnosis not present

## 2022-09-14 DIAGNOSIS — I10 Essential (primary) hypertension: Secondary | ICD-10-CM | POA: Insufficient documentation

## 2022-09-14 DIAGNOSIS — Y990 Civilian activity done for income or pay: Secondary | ICD-10-CM | POA: Insufficient documentation

## 2022-09-14 DIAGNOSIS — Z7985 Long-term (current) use of injectable non-insulin antidiabetic drugs: Secondary | ICD-10-CM | POA: Insufficient documentation

## 2022-09-14 DIAGNOSIS — Z79899 Other long term (current) drug therapy: Secondary | ICD-10-CM | POA: Insufficient documentation

## 2022-09-14 DIAGNOSIS — K219 Gastro-esophageal reflux disease without esophagitis: Secondary | ICD-10-CM | POA: Insufficient documentation

## 2022-09-14 DIAGNOSIS — X58XXXA Exposure to other specified factors, initial encounter: Secondary | ICD-10-CM | POA: Diagnosis not present

## 2022-09-14 DIAGNOSIS — S83512A Sprain of anterior cruciate ligament of left knee, initial encounter: Secondary | ICD-10-CM | POA: Insufficient documentation

## 2022-09-14 DIAGNOSIS — Z01812 Encounter for preprocedural laboratory examination: Secondary | ICD-10-CM

## 2022-09-14 HISTORY — PX: KNEE ARTHROSCOPY WITH MEDIAL MENISECTOMY: SHX5651

## 2022-09-14 LAB — GLUCOSE, CAPILLARY
Glucose-Capillary: 316 mg/dL — ABNORMAL HIGH (ref 70–99)
Glucose-Capillary: 301 mg/dL — ABNORMAL HIGH (ref 70–99)
Glucose-Capillary: 289 mg/dL — ABNORMAL HIGH (ref 70–99)

## 2022-09-14 SURGERY — ARTHROSCOPY, KNEE, WITH MEDIAL MENISCECTOMY
Anesthesia: General | Site: Knee | Laterality: Left

## 2022-09-14 MED ORDER — RINGERS IRRIGATION IR SOLN
Status: DC | PRN
  Administered 2022-09-14: 6000 mL
  Administered 2022-09-14: 3000 mL

## 2022-09-14 MED ORDER — PHENYLEPHRINE 80 MCG/ML (10ML) SYRINGE FOR IV PUSH (FOR BLOOD PRESSURE SUPPORT)
PREFILLED_SYRINGE | INTRAVENOUS | Status: DC | PRN
  Administered 2022-09-14 (×4): 80 ug via INTRAVENOUS
  Administered 2022-09-14: 40 ug via INTRAVENOUS
  Administered 2022-09-14: 80 ug via INTRAVENOUS

## 2022-09-14 MED ORDER — CHLORHEXIDINE GLUCONATE 0.12 % MT SOLN: 15.0000 mL | Freq: Once | OROMUCOSAL | Status: AC

## 2022-09-14 MED ORDER — PROPOFOL 10 MG/ML IV BOLUS
INTRAVENOUS | Status: DC | PRN
  Administered 2022-09-14: 200 mg via INTRAVENOUS

## 2022-09-14 MED ORDER — DEXMEDETOMIDINE HCL IN NACL 80 MCG/20ML IV SOLN
INTRAVENOUS | Status: DC | PRN
  Administered 2022-09-14 (×2): 8 ug via BUCCAL
  Administered 2022-09-14: 12 ug via BUCCAL
  Administered 2022-09-14: 4 ug via BUCCAL

## 2022-09-14 MED ORDER — MIDAZOLAM HCL 2 MG/2ML IJ SOLN
INTRAMUSCULAR | Status: DC | PRN
  Administered 2022-09-14: 2 mg via INTRAVENOUS

## 2022-09-14 MED ORDER — ACETAMINOPHEN 500 MG PO TABS: 1000.0000 mg | ORAL_TABLET | Freq: Once | ORAL | Status: AC

## 2022-09-14 MED ORDER — INSULIN ASPART 100 UNIT/ML IJ SOLN
10.0000 [IU] | Freq: Once | INTRAMUSCULAR | Status: AC
  Administered 2022-09-14: 10 [IU] via SUBCUTANEOUS

## 2022-09-14 MED ORDER — LIDOCAINE HCL (CARDIAC) PF 100 MG/5ML IV SOSY
PREFILLED_SYRINGE | INTRAVENOUS | Status: DC | PRN
  Administered 2022-09-14: 100 mg via INTRAVENOUS

## 2022-09-14 MED ORDER — CEFAZOLIN IN SODIUM CHLORIDE 3-0.9 GM/100ML-% IV SOLN
3.0000 g | INTRAVENOUS | Status: AC
  Administered 2022-09-14: 3 g via INTRAVENOUS
  Filled 2022-09-14: qty 100

## 2022-09-14 MED ORDER — EPINEPHRINE PF 1 MG/ML IJ SOLN
INTRAMUSCULAR | Status: AC
  Filled 2022-09-14: qty 2

## 2022-09-14 MED ORDER — MIDAZOLAM HCL 2 MG/2ML IJ SOLN
INTRAMUSCULAR | Status: AC
  Administered 2022-09-14: 2 mg via INTRAVENOUS
  Filled 2022-09-14: qty 2

## 2022-09-14 MED ORDER — FENTANYL CITRATE (PF) 100 MCG/2ML IJ SOLN
INTRAMUSCULAR | Status: AC
  Filled 2022-09-14: qty 2

## 2022-09-14 MED ORDER — OXYCODONE HCL 5 MG PO TABS
ORAL_TABLET | ORAL | Status: AC
  Filled 2022-09-14: qty 1

## 2022-09-14 MED ORDER — MIDAZOLAM HCL 2 MG/2ML IJ SOLN: 2.0000 mg | Freq: Once | INTRAMUSCULAR | Status: AC

## 2022-09-14 MED ORDER — LIDOCAINE HCL (PF) 1 % IJ SOLN
INTRAMUSCULAR | Status: AC
  Filled 2022-09-14: qty 5

## 2022-09-14 MED ORDER — INSULIN ASPART 100 UNIT/ML IJ SOLN: 10.0000 [IU] | Freq: Once | INTRAMUSCULAR | Status: AC

## 2022-09-14 MED ORDER — SUGAMMADEX SODIUM 500 MG/5ML IV SOLN
INTRAVENOUS | Status: DC | PRN
  Administered 2022-09-14: 400 mg via INTRAVENOUS

## 2022-09-14 MED ORDER — BUPIVACAINE HCL (PF) 0.25 % IJ SOLN
INTRAMUSCULAR | Status: AC
  Filled 2022-09-14: qty 30

## 2022-09-14 MED ORDER — LACTATED RINGERS IV SOLN
INTRAVENOUS | Status: DC | PRN
  Administered 2022-09-14: 9003 mL

## 2022-09-14 MED ORDER — ACETAMINOPHEN 500 MG PO TABS
ORAL_TABLET | ORAL | Status: AC
  Administered 2022-09-14: 1000 mg via ORAL
  Filled 2022-09-14: qty 2

## 2022-09-14 MED ORDER — INSULIN ASPART 100 UNIT/ML IJ SOLN
INTRAMUSCULAR | Status: AC
  Administered 2022-09-14: 10 [IU]
  Filled 2022-09-14: qty 1

## 2022-09-14 MED ORDER — 0.9 % SODIUM CHLORIDE (POUR BTL) OPTIME
TOPICAL | Status: DC | PRN
  Administered 2022-09-14: 1000 mL

## 2022-09-14 MED ORDER — FENTANYL CITRATE (PF) 100 MCG/2ML IJ SOLN
INTRAMUSCULAR | Status: DC | PRN
  Administered 2022-09-14 (×4): 50 ug via INTRAVENOUS

## 2022-09-14 MED ORDER — ROCURONIUM BROMIDE 100 MG/10ML IV SOLN
INTRAVENOUS | Status: DC | PRN
  Administered 2022-09-14 (×2): 20 mg via INTRAVENOUS
  Administered 2022-09-14: 100 mg via INTRAVENOUS
  Administered 2022-09-14 (×4): 20 mg via INTRAVENOUS

## 2022-09-14 MED ORDER — MIDAZOLAM HCL 2 MG/2ML IJ SOLN
INTRAMUSCULAR | Status: AC
  Filled 2022-09-14: qty 2

## 2022-09-14 MED ORDER — OXYCODONE HCL 5 MG/5ML PO SOLN: 5.0000 mg | Freq: Once | ORAL | Status: AC | PRN

## 2022-09-14 MED ORDER — BUPIVACAINE HCL (PF) 0.5 % IJ SOLN
INTRAMUSCULAR | Status: DC | PRN
  Administered 2022-09-14: 20 mL

## 2022-09-14 MED ORDER — INSULIN ASPART 100 UNIT/ML IJ SOLN
INTRAMUSCULAR | Status: AC
  Filled 2022-09-14: qty 1

## 2022-09-14 MED ORDER — HYDROMORPHONE HCL 1 MG/ML IJ SOLN
INTRAMUSCULAR | Status: AC
  Filled 2022-09-14: qty 1

## 2022-09-14 MED ORDER — ORAL CARE MOUTH RINSE: 15.0000 mL | Freq: Once | OROMUCOSAL | Status: AC

## 2022-09-14 MED ORDER — OXYCODONE HCL 5 MG PO TABS
5.0000 mg | ORAL_TABLET | Freq: Once | ORAL | Status: AC | PRN
  Administered 2022-09-14: 5 mg via ORAL

## 2022-09-14 MED ORDER — SODIUM CHLORIDE 0.9 % IV SOLN: INTRAVENOUS | Status: DC

## 2022-09-14 MED ORDER — CHLORHEXIDINE GLUCONATE 0.12 % MT SOLN
OROMUCOSAL | Status: AC
  Administered 2022-09-14: 15 mL via OROMUCOSAL
  Filled 2022-09-14: qty 15

## 2022-09-14 MED ORDER — ESMOLOL HCL 100 MG/10ML IV SOLN
INTRAVENOUS | Status: DC | PRN
  Administered 2022-09-14 (×2): 20 mg via INTRAVENOUS
  Administered 2022-09-14: 10 mg via INTRAVENOUS

## 2022-09-14 MED ORDER — HYDROMORPHONE HCL 1 MG/ML IJ SOLN
0.2500 mg | INTRAMUSCULAR | Status: DC | PRN
  Administered 2022-09-14 (×4): 0.25 mg via INTRAVENOUS

## 2022-09-14 MED ORDER — BUPIVACAINE HCL (PF) 0.5 % IJ SOLN
INTRAMUSCULAR | Status: AC
  Filled 2022-09-14: qty 20

## 2022-09-14 SURGICAL SUPPLY — 72 items
ADAPTER IRRIG TUBE 2 SPIKE SOL (ADAPTER) ×1 IMPLANT
ADHESIVE MASTISOL STRL (MISCELLANEOUS) IMPLANT
ANCHOR BUTTON TIGHTROPE 14 (Anchor) IMPLANT
BIT DRILL PIN RETRO (DRILL) IMPLANT
BLADE FULL RADIUS 3.5 (BLADE) IMPLANT
BLADE INCISOR PLUS 4.5 (BLADE) ×1 IMPLANT
BLADE SHAVER 4.5 DBL SERAT CV (CUTTER) ×1 IMPLANT
BLADE SHAVER 4.5X7 STR FR (MISCELLANEOUS) IMPLANT
BLADE SURG 15 STRL LF DISP TIS (BLADE) ×1 IMPLANT
BLADE SURG 15 STRL SS (BLADE) ×1
BLADE SURG SZ10 CARB STEEL (BLADE) ×1 IMPLANT
BLADE SURG SZ11 CARB STEEL (BLADE) ×1 IMPLANT
BNDG ELASTIC 6X5.8 VLCR NS LF (GAUZE/BANDAGES/DRESSINGS) ×1 IMPLANT
BNDG ESMARK 6X12 TAN STRL LF (GAUZE/BANDAGES/DRESSINGS) ×1 IMPLANT
BRUSH SCRUB EZ  4% CHG (MISCELLANEOUS)
BRUSH SCRUB EZ 4% CHG (MISCELLANEOUS) ×2 IMPLANT
BUR ACROMIONIZER 4.0 (BURR) IMPLANT
BUR OVAL 4.0 (BURR) IMPLANT
CHLORAPREP W/TINT 26 (MISCELLANEOUS) ×2 IMPLANT
COVER MAYO STAND STRL (DRAPES) ×1 IMPLANT
CUFF TOURN SGL QUICK 24 (TOURNIQUET CUFF)
CUFF TOURN SGL QUICK 34 (TOURNIQUET CUFF)
CUFF TRNQT CYL 24X4X16.5-23 (TOURNIQUET CUFF) IMPLANT
CUFF TRNQT CYL 34X4.125X (TOURNIQUET CUFF) IMPLANT
DRAIN PENROSE 12X.25 LTX STRL (MISCELLANEOUS) IMPLANT
DRAPE ARTHRO LIMB 89X125 STRL (DRAPES) ×1 IMPLANT
DRAPE FLUOR MINI C-ARM 54X84 (DRAPES) IMPLANT
DRAPE IMP U-DRAPE 54X76 (DRAPES) ×1 IMPLANT
DRILL PIN RETRO (DRILL) ×1
DRSG XEROFORM 1X8 (GAUZE/BANDAGES/DRESSINGS) ×1 IMPLANT
GAUZE SPONGE 4X4 12PLY STRL (GAUZE/BANDAGES/DRESSINGS) ×1 IMPLANT
GAUZE XEROFORM 1X8 LF (GAUZE/BANDAGES/DRESSINGS) ×1 IMPLANT
GLOVE BIO SURGEON STRL SZ7.5 (GLOVE) ×1 IMPLANT
GLOVE SURG UNDER POLY LF SZ7.5 (GLOVE) ×1 IMPLANT
GOWN STRL REUS W/ TWL LRG LVL3 (GOWN DISPOSABLE) ×1 IMPLANT
GOWN STRL REUS W/ TWL XL LVL3 (GOWN DISPOSABLE) ×2 IMPLANT
GOWN STRL REUS W/TWL LRG LVL3 (GOWN DISPOSABLE) ×1
GOWN STRL REUS W/TWL XL LVL3 (GOWN DISPOSABLE) ×2
GRAFT TISS QUADRICEP TEND 9-11 (Tissue) IMPLANT
IMP SYS 2ND FIX PEEK 4.75X19.1 (Miscellaneous) ×1 IMPLANT
IMPL SYS 2ND FX PEEK 4.75X19.1 (Miscellaneous) IMPLANT
IV LACTATED RINGER IRRG 3000ML (IV SOLUTION) ×4
IV LR IRRIG 3000ML ARTHROMATIC (IV SOLUTION) ×2 IMPLANT
KIT TURNOVER KIT A (KITS) ×1 IMPLANT
MANIFOLD NEPTUNE II (INSTRUMENTS) ×2 IMPLANT
MAT ABSORB  FLUID 56X50 GRAY (MISCELLANEOUS) ×1
MAT ABSORB FLUID 56X50 GRAY (MISCELLANEOUS) ×1 IMPLANT
NS IRRIG 1000ML POUR BTL (IV SOLUTION) ×1 IMPLANT
PACK ARTHROSCOPY KNEE (MISCELLANEOUS) ×1 IMPLANT
PAD ABD DERMACEA PRESS 5X9 (GAUZE/BANDAGES/DRESSINGS) ×2 IMPLANT
SLEEVE REMOTE CONTROL 5X12 (DRAPES) IMPLANT
STRIP CLOSURE SKIN 1/2X4 (GAUZE/BANDAGES/DRESSINGS) IMPLANT
SUT 2 FIBERLOOP 20 STRT BLUE (SUTURE)
SUT FIBERWIRE #2 38 T-5 BLUE (SUTURE)
SUT FIBERWIRE #5 38 CONV NDL (SUTURE)
SUT MNCRL 3-0 VIOLET CT-1 (SUTURE) IMPLANT
SUT MNCRL AB 3-0 PS2 27 (SUTURE) ×1 IMPLANT
SUT MONOCRYL 3-0 (SUTURE)
SUT VIC AB 0 CT1 27 (SUTURE)
SUT VIC AB 0 CT1 27XBRD ANBCTR (SUTURE) IMPLANT
SUT VIC AB 2-0 CT2 27 (SUTURE) IMPLANT
SUTURE 2 FIBERLOOP 20 STRT BLU (SUTURE) IMPLANT
SUTURE FIBERWR #2 38 T-5 BLUE (SUTURE) IMPLANT
SUTURE FIBERWR #5 38 CONV NDL (SUTURE) IMPLANT
SYS ALLOGRAFT GRAFTLINK CP2 (Anchor) ×1 IMPLANT
SYSTEM ALLOGRAFT GRAFTLINK CP2 (Anchor) IMPLANT
TISSUE FLEXIGRAFT QUADLINK (Tissue) ×1 IMPLANT
TOWEL OR 17X26 4PK STRL BLUE (TOWEL DISPOSABLE) ×1 IMPLANT
TUBING INFLOW SET DBFLO PUMP (TUBING) ×1 IMPLANT
TUBING OUTFLOW SET DBLFO PUMP (TUBING) IMPLANT
WAND WEREWOLF FLOW 90D (MISCELLANEOUS) ×1 IMPLANT
WATER STERILE IRR 500ML POUR (IV SOLUTION) ×1 IMPLANT

## 2022-09-14 NOTE — Anesthesia Procedure Notes (Signed)
Procedure Name: Intubation Date/Time: 09/14/2022 11:06 AM  Performed by: Lily Peer, Jalexa Pifer, CRNAPre-anesthesia Checklist: Patient identified, Emergency Drugs available, Suction available and Patient being monitored Patient Re-evaluated:Patient Re-evaluated prior to induction Oxygen Delivery Method: Circle system utilized Preoxygenation: Pre-oxygenation with 100% oxygen Induction Type: IV induction Ventilation: Mask ventilation without difficulty Laryngoscope Size: McGraph and 4 Tube type: Oral Tube size: 7.5 mm Number of attempts: 1 Airway Equipment and Method: Stylet and Oral airway Placement Confirmation: ETT inserted through vocal cords under direct vision, positive ETCO2 and breath sounds checked- equal and bilateral Secured at: 20 cm Tube secured with: Tape Dental Injury: Teeth and Oropharynx as per pre-operative assessment

## 2022-09-14 NOTE — H&P (Signed)
The patient has been re-examined, and the chart reviewed, and there have been no interval changes to the documented history and physical.    The risks, benefits, and alternatives have been discussed at length, and the patient is willing to proceed.    Perry Macdonald  

## 2022-09-14 NOTE — Anesthesia Postprocedure Evaluation (Signed)
Anesthesia Post Note  Patient: Perry Macdonald  Procedure(s) Performed: LT KNEE REVISION ACL (Left: Knee)  Patient location during evaluation: PACU Anesthesia Type: General Level of consciousness: awake and alert Pain management: pain level controlled Vital Signs Assessment: post-procedure vital signs reviewed and stable Respiratory status: spontaneous breathing, nonlabored ventilation, respiratory function stable and patient connected to nasal cannula oxygen Cardiovascular status: blood pressure returned to baseline and stable Postop Assessment: no apparent nausea or vomiting Anesthetic complications: no   No notable events documented.   Last Vitals:  Vitals:   09/14/22 1415 09/14/22 1430  BP: (!) 95/56 113/71  Pulse: 93 93  Resp: 19 17  Temp:    SpO2: 99% 98%    Last Pain:  Vitals:   09/14/22 1411  TempSrc:   PainSc: Asleep                 Ilene Qua

## 2022-09-14 NOTE — Transfer of Care (Signed)
Immediate Anesthesia Transfer of Care Note  Patient: Perry Macdonald  Procedure(s) Performed: LT KNEE REVISION ACL (Left: Knee)  Patient Location: PACU  Anesthesia Type:General  Level of Consciousness: awake, alert , and oriented  Airway & Oxygen Therapy: Patient Spontanous Breathing and Patient connected to face mask oxygen  Post-op Assessment: Report given to RN and Post -op Vital signs reviewed and stable  Post vital signs: Reviewed and stable  Last Vitals:  Vitals Value Taken Time  BP    Temp    Pulse 96 09/14/22 1411  Resp    SpO2 95 % 09/14/22 1411  Vitals shown include unvalidated device data.  Last Pain:  Vitals:   09/14/22 1051  TempSrc:   PainSc: 0-No pain         Complications: No notable events documented.

## 2022-09-14 NOTE — Progress Notes (Signed)
Checked upon arrival Patient CBG 316. Dr. Danne Baxter made aware. No orders at this time.

## 2022-09-14 NOTE — Anesthesia Preprocedure Evaluation (Signed)
Anesthesia Evaluation  Patient identified by MRN, date of birth, ID band Patient awake    Reviewed: Allergy & Precautions, NPO status , Patient's Chart, lab work & pertinent test results  History of Anesthesia Complications Negative for: history of anesthetic complications  Airway Mallampati: III  TM Distance: >3 FB Neck ROM: full    Dental  (+) Chipped, Teeth Intact   Pulmonary neg pulmonary ROS   Pulmonary exam normal        Cardiovascular Exercise Tolerance: Good hypertension, On Medications and On Home Beta Blockers Normal cardiovascular exam     Neuro/Psych  PSYCHIATRIC DISORDERS Anxiety     negative neurological ROS     GI/Hepatic Neg liver ROS,GERD  Medicated,,  Endo/Other  negative endocrine ROSdiabetes  Last dose of Ozempic 10/23  Renal/GU      Musculoskeletal   Abdominal   Peds  Hematology negative hematology ROS (+)   Anesthesia Other Findings Past Medical History: No date: Anxiety No date: Diabetes mellitus without complication (HCC) No date: GERD (gastroesophageal reflux disease) No date: Hypertension  Past Surgical History: 2013: ARTHROSCOPIC REPAIR ACL; Left 04/2022: MENISCUS REPAIR; Left No date: TONSILLECTOMY     Reproductive/Obstetrics negative OB ROS                             Anesthesia Physical Anesthesia Plan  ASA: 3  Anesthesia Plan: General ETT   Post-op Pain Management: Regional block*, Toradol IV (intra-op)* and Tylenol PO (pre-op)*   Induction: Intravenous  PONV Risk Score and Plan: 3 and Ondansetron, Dexamethasone, Midazolam and Treatment may vary due to age or medical condition  Airway Management Planned: Oral ETT  Additional Equipment:   Intra-op Plan:   Post-operative Plan: Extubation in OR  Informed Consent: I have reviewed the patients History and Physical, chart, labs and discussed the procedure including the risks, benefits and  alternatives for the proposed anesthesia with the patient or authorized representative who has indicated his/her understanding and acceptance.     Dental Advisory Given  Plan Discussed with: Anesthesiologist, CRNA and Surgeon  Anesthesia Plan Comments: (Patient consented for risks of anesthesia including but not limited to:  - adverse reactions to medications - damage to eyes, teeth, lips or other oral mucosa - nerve damage due to positioning  - sore throat or hoarseness - Damage to heart, brain, nerves, lungs, other parts of body or loss of life  Patient voiced understanding.)        Anesthesia Quick Evaluation

## 2022-09-14 NOTE — Op Note (Addendum)
DATE OF SURGERY:  09/14/2022  TIME: 2:36 PM  PATIENT NAME:  Perry Macdonald  AGE: 36 y.o.  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 year old who sustained a work-related injury to the left knee.  He had a history of prior ACL reconstruction in 2013.  Advanced imaging showed the presence of a full-thickness ACL rupture as well as tricompartmental chondromalacia and evidence of prior partial lateral meniscectomy.  The risks benefits and alternatives were discussed with the patient including but not limited to the risks of bleeding, infection, neurovascular damage, stiffness, retear, reoperation, arthritis, weakness, numbness, need for further surgery, blood clot, risk of anesthesia, etc.  After discussion, they were willing to proceed.    Note: 22 modifier will be utilized for the ACL reconstruction given the revision scenario.  There was significant scarring  within the knee joint, which required lysis of adhesions and mobilization of the tissues for improved visualization.  Care was taken to identify prior femoral and tibial bone tunnels, which required successive reaming in order to clear out all tissue and suture material.  A notchplasty was required for improved visualization of the femoral ACL footprint. Care was taken during creation of the new femoral tunnel to avoid the old tunnel in order to optimize location at the ACL footprint.  As result of the complexity involved and revision ACL reconstruction, the procedure took approximately 200% longer than typical ACL reconstruction and thus will be billed with a 22 modifier.  OPERATIVE PROCEDURE:  The patient was brought to the operating room and placed in the supine position.  General anesthesia was administered.  Left lower extremity was prepped and draped in the normal standard sterile fashion.  Preoperative timeout was taken and preoperative antibiotics were administered.  Exam under anesthesia showed range of motion from 0 to 125 degrees of flexion.  Knee was stable with varus and valgus stress at 0 and 30 degrees.  There was a grade 2 Lachman with soft endpoint.  Grade 2 pivot.  Stable posterior drawer.  Graft operation: On the back table, the physicians assistant prepared the quadriceps tendon allograft high-strength #2 suture on both ends.  Graft measured 10 mm in diameter and 70 mm in length.  This was left in graft tubes under tension during the remainder of the procedure.  Standard medial and lateral arthroscopic portals were made.  Arthroscope was inserted and the joint was insufflated with normal saline.  There were thick adhesions within the infrapatellar fat pad requiring lysis of adhesions with radiofrequency device.  Diagnostic arthroscopy findings are as noted above.  There  is diffuse fraying of the ACL graft fibers which was debrided with a curved shaver.  The ACL femoral tunnel was noted  to be somewhat anterior to the typical femoral insertion site.  A notchplasty was performed with a 4 mm bur for improved visualization of the intercondylar notch and for access to the more posterior aspect of the femoral condyle for appropriate placement of the tunnel.  Inspection of the medial compartment revealed relatively diffuse chondral thinning with an intact medial meniscus and meniscal root.  Inspection of the lateral compartment revealed diffuse chondral thinning with patchy areas of grade 3 chondral defects on the lateral femoral condyle.  There is evidence of prior partial lateral meniscectomy with relatively diminutive posterior horn lateral meniscus and lateral meniscus root.  There was no evidence of obvious new or unstable tearing of the lateral meniscus.  Flip cutter device was utilized to ream a 20 millimeter by 10 mm femoral socket posterior to the prior bone tunnel.  Suture and graft material was encountered at the initial portion of the tunnel.  Care was taken to remove suture material and to ensure circumferential bone tunnel with solid walls for graft bone healing.  Passing suture was placed.  3 cm incision was made in line with the prior tibial incision.  There was significant scarring.  Tibial tunnel was identified.  Successive reaming was performed up to a 10 mm reamer.  Curettes were utilized to clear graft debris and suture debris from the tunnel.  Tunnel was felt to be in appropriate location adjacent to the anterior horn of the lateral meniscus.  Graft was then shuttled through the tibia at the lateral femoral cortex.  Femoral cortical button was flipped and checked with mini C-arm fluoroscopy to ensure it was flush to the lateral femoral cortex.  Sutures were tensioned and the graft was delivered into the femoral tunnel.  Knee was then brought into extension and a 14 mm tibial ABS button was utilized for tibial fixation.  Knee was cycled numerous times.  Final tensioning was  performed on the femoral and tibial side.  Arthroscope was utilized to take final pictures of the graft which was noted to be taut through full range of motion with no impingement in extension.  Tibial sided sutures were fixed with a swivel lock suture anchor as backup fixation.  Wounds were thoroughly irrigated.  They are closed in layered fashion with 2-0 Vicryl followed by 3-0 Monocryl.  Steri-Strips and a well-padded sterile dressing were applied followed by a T ROM knee brace locked in extension.  Sponge and needle count were correct.   The patient was awakened and returned to PACU in stable and satisfactory condition. There no complications and the patient tolerated the procedure well.   POSTOPERATIVE PLAN: He will be weightbearing as tolerated and will follow the ACL reconstruction protocol with PT.  He will follow-up in clinic in 2 weeks for clinical recheck and two-view x-rays of the knee.  Renee Harder

## 2022-09-14 NOTE — Anesthesia Procedure Notes (Signed)
Anesthesia Regional Block: Adductor canal block   Pre-Anesthetic Checklist: , timeout performed,  Correct Patient, Correct Site, Correct Laterality,  Correct Procedure, Correct Position, site marked,  Risks and benefits discussed,  Surgical consent,  Pre-op evaluation,  At surgeon's request and post-op pain management  Laterality: Lower and Left  Prep: chloraprep       Needles:  Injection technique: Single-shot  Needle Type: Echogenic Needle     Needle Length: 9cm  Needle Gauge: 21     Additional Needles:   Procedures:,,,, ultrasound used (permanent image in chart),,    Narrative:  Start time: 09/14/2022 10:45 AM End time: 09/14/2022 10:47 AM Injection made incrementally with aspirations every 5 mL.  Performed by: Personally  Anesthesiologist: Ilene Qua, MD  Additional Notes: Patient's chart reviewed and they were deemed appropriate candidate for procedure, per surgeon's request. Patient educated about risks, benefits, and alternatives of the block including but not limited to: temporary or permanent nerve damage, bleeding, infection, damage to surround tissues, block failure, local anesthetic toxicity. Patient expressed understanding. A formal time-out was conducted consistent with institution rules.  Monitors were applied, and minimal sedation used (see nursing record). The site was prepped with skin prep and allowed to dry, and sterile gloves were used. A high frequency linear ultrasound probe with probe cover was utilized throughout. Femoral artery visualized at mid-thigh level, local anesthetic injected anterolateral to it, and echogenic block needle trajectory was monitored throughout. Hydrodissection of saphenous nerve visualized and appeared anatomically normal. Aspiration performed every 86ml. Blood vessels were avoided. All injections were performed without resistance and free of blood and paresthesias. The patient tolerated the procedure well.  Injectate: 20cc 0.5%  bupi

## 2022-09-14 NOTE — Inpatient Diabetes Management (Signed)
Inpatient Diabetes Program Recommendations  AACE/ADA: New Consensus Statement on Inpatient Glycemic Control   Target Ranges:  Prepandial:   less than 140 mg/dL      Peak postprandial:   less than 180 mg/dL (1-2 hours)      Critically ill patients:  140 - 180 mg/dL    Latest Reference Range & Units 09/14/22 10:17  Glucose-Capillary 70 - 99 mg/dL 316 (H)    Latest Reference Range & Units 09/11/22 09:13  Glucose 70 - 99 mg/dL 302 (H)   Review of Glycemic Control  Diabetes history: DM2 Outpatient Diabetes medications: Novolog 70/30 60 units BID, Metformin 1000 mg BID, Ozempic 1 mg Qweek (Monday) Current orders for Inpatient glycemic control: None; in Pre-Op  Inpatient Diabetes Program Recommendations:    Insulin: If patient is admitted following surgery, please consider ordering CBGs AC&HS, Novolog 0-20 units AC&HS, and Levemir 25 units BID (based on 163.7 kg x 0.3 units).  Thanks, Barnie Alderman, RN, MSN, [place] [facility] Coordinator Inpatient Diabetes Program [phone] (Team Pager from 8am to [place])

## 2022-09-14 NOTE — Discharge Instructions (Addendum)
Procedure: Left knee revision ACL reconstruction  Keep dressing on for the first 3 days, then okay to change dressing.  Keep incisions clean and dry while showering.  No bathing or submerging underwater.  Knee brace should be locked in extension at rest and while ambulating.  Otherwise okay to work on knee range of motion at home and with PT.  Use crutches as needed.  Therapy is scheduled on 11/6 at 10:45am [facility]  Medication: Okay to take up to 4000 mg of Tylenol in a 24-hour period Take Norco as needed Aspirin 81 mg twice daily until full weightbearing starting postop day 1  Clinic follow-up on 11/13 at 10:45 AM  AMBULATORY SURGERY  DISCHARGE INSTRUCTIONS   The drugs that you were given will stay in your system until tomorrow so for the next 24 hours you should not:  Drive an automobile Make any legal decisions Drink any alcoholic beverage   You may resume regular meals tomorrow.  Today it is better to start with liquids and gradually work up to solid foods.  You may eat anything you prefer, but it is better to start with liquids, then soup and crackers, and gradually work up to solid foods.   Please notify your doctor immediately if you have any unusual bleeding, trouble breathing, redness and pain at the surgery site, drainage, fever, or pain not relieved by medication.    Additional Instructions:        Please contact your physician with any problems or Same Day Surgery at [phone], Monday through Friday 6 am to 4 pm, or [place] at [facility] number at [phone].

## 2022-09-15 ENCOUNTER — Encounter: Payer: Self-pay | Admitting: Orthopaedic Surgery

## 2023-07-08 ENCOUNTER — Encounter: Payer: Self-pay | Admitting: Emergency Medicine

## 2023-07-08 ENCOUNTER — Ambulatory Visit
Admission: EM | Admit: 2023-07-08 | Discharge: 2023-07-08 | Disposition: A | Discharge status: Home / Self Care | Payer: Managed Care, Other (non HMO) | Attending: Emergency Medicine | Admitting: Emergency Medicine

## 2023-07-08 DIAGNOSIS — B009 Herpesviral infection, unspecified: Secondary | ICD-10-CM | POA: Diagnosis not present

## 2023-07-08 DIAGNOSIS — Z760 Encounter for issue of repeat prescription: Secondary | ICD-10-CM | POA: Diagnosis not present

## 2023-07-08 MED ORDER — VALACYCLOVIR HCL 1 G PO TABS: 2000.0000 mg | ORAL_TABLET | Freq: Two times a day (BID) | ORAL | 1 refills | Status: AC

## 2023-07-08 NOTE — ED Triage Notes (Signed)
Patient has history of oral herpes and is out of his preventative medication for this.  Patient states that he would like a refill before he gets an [place].

## 2023-07-08 NOTE — ED Provider Notes (Signed)
[facility]    CSN: [id number] Arrival date & time: 07/08/23  0930      History   Chief Complaint Chief Complaint  Patient presents with   Mouth Lesions   Medication Refill    HPI Perry Macdonald is a 37 y.o. male.   HPI  37 year old male with a past medical history significant for hypertension, diabetes, GERD, anxiety, and HSV 1 presents requesting a refill of his preventative medication as he is out.  He reports that he had an outbreak on his mouth on the left side last week.  No current lesions.  He is experiencing the tingling in his lips that he feels prior to an outbreak.  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 Authorizing Provider  amLODipine (NORVASC) 10 MG tablet Take 10 mg by mouth daily. 06/12/19  Yes [provider]  hydrochlorothiazide (HYDRODIURIL) 25 MG tablet  01/27/19  Yes [provider]  Insulin Isophane & Regular Human (HUMULIN 70/30 KWIKPEN) (70-30) 100 UNIT/ML PEN Inject 60 units with breakfast and 60 units with dinner 05/20/19  Yes [provider]   metFORMIN (GLUCOPHAGE) 1000 MG tablet Take 1,000 mg by mouth 2 (two) times daily with a meal. 01/22/16  Yes [provider]  olmesartan (BENICAR) 40 MG tablet  01/03/19  Yes [provider]  omeprazole (PRILOSEC) 20 MG capsule Take 20 mg by mouth daily.   Yes [provider]  Semaglutide (OZEMPIC, 1 MG/DOSE, [place]) Inject 1 Dose into the skin once a week. Takes on Monday   Yes [provider]  spironolactone (ALDACTONE) 50 MG tablet Take 50 mg by mouth daily.   Yes [provider]  valACYclovir (VALTREX) 1000 MG tablet Take 2 tablets (2,000 mg total) by mouth 2 (two) times daily. Take 2 tablets by mouth every 12 hours for 1 day for any outbreak of cold sores. 07/08/23  Yes Becky Augusta, NP  B-D UF III MINI PEN NEEDLES 31G X 5 MM MISC  03/03/16   [provider]  Blood Glucose Monitoring Suppl (FIFTY50 GLUCOSE METER 2.0) w/Device KIT Use as directed 08/01/18   [provider]  Insulin Pen Needle (FIFTY50 PEN NEEDLES) 31G X 5 MM MISC as directed 04/04/19   [provider]  meloxicam (MOBIC) 15 MG tablet Take 15 mg by mouth daily.    [provider]  metoprolol succinate (TOPROL-XL) 100 MG 24 hr tablet Take 100 mg by mouth daily. Take with or immediately following a meal.    [provider]  Family History History reviewed. No pertinent family history.  Social History Social History   Tobacco Use   Smoking status: Never   Smokeless tobacco: Never  Vaping Use   Vaping status: Former  Substance Use Topics   Alcohol use: Not Currently   Drug use: No     Allergies   Lisinopril, Bee venom, and Sulfa antibiotics   Review of Systems Review of Systems  Constitutional:  Negative for fever.  HENT:  Negative for mouth sores.      Physical Exam Triage Vital Signs ED Triage Vitals  Encounter Vitals Group     BP 07/08/23 1012 (!) 148/98     Systolic BP Percentile --      Diastolic BP Percentile --       Pulse Rate 07/08/23 1012 (!) 104     Resp 07/08/23 1012 15     Temp 07/08/23 1012 98.4 F (36.9 C)     Temp Source 07/08/23 1012 Oral     SpO2 07/08/23 1012 96 %     Weight 07/08/23 1010 (!) 360 lb 14.3 oz (163.7 kg)     Height 07/08/23 1010 6\' 3"  (1.905 m)     Head Circumference --      Peak Flow --      Pain Score 07/08/23 1010 0     Pain Loc --      Pain Education --      Exclude from Growth Chart --    No data found.  Updated Vital Signs BP (!) 148/98 (BP Location: Right Arm)   Pulse (!) 104   Temp 98.4 F (36.9 C) (Oral)   Resp 15   Ht 6\' 3"  (1.905 m)   Wt (!) 360 lb 14.3 oz (163.7 kg)   SpO2 96%   BMI 45.11 kg/m   Visual Acuity Right Eye Distance:   Left Eye Distance:   Bilateral Distance:    Right Eye Near:   Left Eye Near:    Bilateral Near:     Physical Exam Vitals and nursing note reviewed.  Constitutional:      Appearance: Normal appearance. He is not ill-appearing.  HENT:     Mouth/Throat:     Mouth: Mucous membranes are moist.     Pharynx: Oropharynx is clear. No oropharyngeal exudate or posterior oropharyngeal erythema.  Skin:    General: Skin is warm and dry.     Capillary Refill: Capillary refill takes less than 2 seconds.     Findings: No lesion or rash.  Neurological:     General: No focal deficit present.     Mental Status: He is alert and oriented to person, place, and time.      UC Treatments / Results  Labs (all labs ordered are listed, but only abnormal results are displayed) Labs Reviewed - No data to display  EKG   Radiology No results found.  Procedures Procedures (including critical care time)  Medications Ordered in UC Medications - No data to display  Initial Impression / Assessment and Plan / UC Course  I have reviewed the triage vital signs and the nursing notes.  Pertinent labs & imaging results that were available during my care of the patient were reviewed by me and considered in my medical decision making  (see chart for details).   Patient is a nontoxic-appearing 37 year old male presenting with a request for antiviral therapy for HSV 1.  He does not have any active lesions at this time either on his lips  or intraorally.  He states he is taken both Valtrex and acyclovir in the past.  I will discharge him home with a prescription for valacyclovir and have him take 2 g by mouth every 12 hours for 1 day for any outbreak.   Final Clinical Impressions(s) / UC Diagnoses   Final diagnoses:  HSV-1 infection  Medication refill     Discharge Instructions      We have refilled your Valtrex for your HSV-1 treatment.  If you develop symptoms of an outbreak take 2 tablets right away.  If you have an active cold sore take 2 tablets twice daily for 1 day for treatment.     ED Prescriptions     Medication Sig Dispense Auth. Provider   valACYclovir (VALTREX) 1000 MG tablet Take 2 tablets (2,000 mg total) by mouth 2 (two) times daily. Take 2 tablets by mouth every 12 hours for 1 day for any outbreak of cold sores. 30 tablet Becky Augusta, NP      PDMP not reviewed this encounter.   Becky Augusta, NP 07/08/23 1038

## 2023-07-08 NOTE — Discharge Instructions (Addendum)
We have refilled your Valtrex for your HSV-1 treatment.  If you develop symptoms of an outbreak take 2 tablets right away.  If you have an active cold sore take 2 tablets twice daily for 1 day for treatment.
